# Patient Record
Sex: Male | Born: 1935 | Race: Black or African American | Hispanic: No | Smoking: Never smoker
Health system: Southern US, Community
[De-identification: ages and names within clinical notes are randomized; demographics above are authoritative.]

## PROBLEM LIST (undated history)

## (undated) DIAGNOSIS — E119 Type 2 diabetes mellitus without complications: Secondary | ICD-10-CM

## (undated) DIAGNOSIS — U071 COVID-19: Secondary | ICD-10-CM

## (undated) DIAGNOSIS — I10 Essential (primary) hypertension: Secondary | ICD-10-CM

## (undated) DIAGNOSIS — I509 Heart failure, unspecified: Secondary | ICD-10-CM

## (undated) HISTORY — DX: Type 2 diabetes mellitus without complications: E11.9

## (undated) HISTORY — PX: STOMACH SURGERY: SHX791

## (undated) HISTORY — PX: HIP SURGERY: SHX245

## (undated) HISTORY — DX: Essential (primary) hypertension: I10

---

## 2003-06-26 ENCOUNTER — Other Ambulatory Visit: Payer: Self-pay

## 2006-12-21 ENCOUNTER — Emergency Department: Payer: Self-pay | Admitting: Emergency Medicine

## 2007-12-02 ENCOUNTER — Inpatient Hospital Stay: Payer: Self-pay | Admitting: General Surgery

## 2007-12-02 ENCOUNTER — Other Ambulatory Visit: Payer: Self-pay

## 2010-12-10 ENCOUNTER — Emergency Department: Payer: Self-pay | Admitting: Unknown Physician Specialty

## 2012-03-28 ENCOUNTER — Inpatient Hospital Stay: Payer: Self-pay | Admitting: Internal Medicine

## 2012-03-28 LAB — URINALYSIS, COMPLETE
Ketone: NEGATIVE
Nitrite: POSITIVE
Protein: NEGATIVE
Specific Gravity: 1.009 (ref 1.003–1.030)
Squamous Epithelial: NONE SEEN
WBC UR: 797 /HPF (ref 0–5)

## 2012-03-28 LAB — CBC
MCH: 30.4 pg (ref 26.0–34.0)
MCHC: 34.3 g/dL (ref 32.0–36.0)
MCV: 89 fL (ref 80–100)
Platelet: 188 10*3/uL (ref 150–440)
RDW: 14.3 % (ref 11.5–14.5)

## 2012-03-28 LAB — COMPREHENSIVE METABOLIC PANEL
BUN: 42 mg/dL — ABNORMAL HIGH (ref 7–18)
Bilirubin,Total: 0.7 mg/dL (ref 0.2–1.0)
Calcium, Total: 9.4 mg/dL (ref 8.5–10.1)
Chloride: 100 mmol/L (ref 98–107)
Creatinine: 1.76 mg/dL — ABNORMAL HIGH (ref 0.60–1.30)
EGFR (African American): 37 — ABNORMAL LOW
EGFR (Non-African Amer.): 32 — ABNORMAL LOW
Glucose: 263 mg/dL — ABNORMAL HIGH (ref 65–99)
Osmolality: 290 (ref 275–301)
Potassium: 5.3 mmol/L — ABNORMAL HIGH (ref 3.5–5.1)
SGOT(AST): 23 U/L (ref 15–37)
SGPT (ALT): 22 U/L (ref 12–78)
Sodium: 135 mmol/L — ABNORMAL LOW (ref 136–145)

## 2012-03-28 LAB — CK TOTAL AND CKMB (NOT AT ARMC)
CK, Total: 700 U/L — ABNORMAL HIGH (ref 35–232)
CK-MB: 4.3 ng/mL — ABNORMAL HIGH (ref 0.5–3.6)

## 2012-03-29 LAB — BASIC METABOLIC PANEL
Anion Gap: 7 (ref 7–16)
BUN: 37 mg/dL — ABNORMAL HIGH (ref 7–18)
Chloride: 106 mmol/L (ref 98–107)
Creatinine: 1.31 mg/dL — ABNORMAL HIGH (ref 0.60–1.30)
EGFR (African American): 53 — ABNORMAL LOW
EGFR (Non-African Amer.): 46 — ABNORMAL LOW
Glucose: 145 mg/dL — ABNORMAL HIGH (ref 65–99)
Osmolality: 285 (ref 275–301)

## 2012-03-29 LAB — CBC WITH DIFFERENTIAL/PLATELET
Basophil %: 0.3 %
Eosinophil %: 0.4 %
HCT: 34 % — ABNORMAL LOW (ref 40.0–52.0)
HGB: 11.2 g/dL — ABNORMAL LOW (ref 13.0–18.0)
Lymphocyte #: 0.9 10*3/uL — ABNORMAL LOW (ref 1.0–3.6)
Lymphocyte %: 12 %
MCV: 89 fL (ref 80–100)
Monocyte #: 0.8 x10 3/mm (ref 0.2–1.0)
Monocyte %: 10.9 %
RDW: 14.2 % (ref 11.5–14.5)
WBC: 7.2 10*3/uL (ref 3.8–10.6)

## 2012-03-29 LAB — CK: CK, Total: 859 U/L — ABNORMAL HIGH (ref 35–232)

## 2012-03-30 LAB — BASIC METABOLIC PANEL
BUN: 24 mg/dL — ABNORMAL HIGH (ref 7–18)
Chloride: 102 mmol/L (ref 98–107)
Creatinine: 0.97 mg/dL (ref 0.60–1.30)
EGFR (African American): >60
EGFR (Non-African Amer.): >60
Glucose: 230 mg/dL — ABNORMAL HIGH (ref 65–99)
Osmolality: 281 (ref 275–301)
Potassium: 4.7 mmol/L (ref 3.5–5.1)
Sodium: 135 mmol/L — ABNORMAL LOW (ref 136–145)

## 2012-03-30 LAB — CK: CK, Total: 440 U/L — ABNORMAL HIGH (ref 35–232)

## 2012-03-30 LAB — URINE CULTURE

## 2013-09-27 ENCOUNTER — Emergency Department: Payer: Self-pay | Admitting: Emergency Medicine

## 2013-09-27 LAB — CBC
HCT: 35.1 % — AB (ref 40.0–52.0)
HGB: 11.6 g/dL — ABNORMAL LOW (ref 13.0–18.0)
MCH: 29.3 pg (ref 26.0–34.0)
MCHC: 32.9 g/dL (ref 32.0–36.0)
MCV: 89 fL (ref 80–100)
Platelet: 200 10*3/uL (ref 150–440)
RBC: 3.94 10*6/uL — ABNORMAL LOW (ref 4.40–5.90)
RDW: 13.7 % (ref 11.5–14.5)
WBC: 8.7 10*3/uL (ref 3.8–10.6)

## 2013-09-27 LAB — BASIC METABOLIC PANEL
Anion Gap: 7 (ref 7–16)
BUN: 13 mg/dL (ref 7–18)
CO2: 26 mmol/L (ref 21–32)
CREATININE: 1.02 mg/dL (ref 0.60–1.30)
Calcium, Total: 8.9 mg/dL (ref 8.5–10.1)
Chloride: 107 mmol/L (ref 98–107)
EGFR (Non-African Amer.): >60
GLUCOSE: 132 mg/dL — AB (ref 65–99)
Osmolality: 281 (ref 275–301)
Potassium: 4.6 mmol/L (ref 3.5–5.1)
SODIUM: 140 mmol/L (ref 136–145)

## 2013-09-27 LAB — PRO B NATRIURETIC PEPTIDE: B-TYPE NATIURETIC PEPTID: 406 pg/mL (ref 0–450)

## 2013-10-01 ENCOUNTER — Emergency Department: Payer: Self-pay | Admitting: Orthopaedic Surgery

## 2013-10-01 LAB — CBC WITH DIFFERENTIAL/PLATELET
BASOS ABS: 0 10*3/uL (ref 0.0–0.1)
Basophil %: 0.4 %
Eosinophil #: 0.1 10*3/uL (ref 0.0–0.7)
Eosinophil %: 0.9 %
HCT: 38.8 % — AB (ref 40.0–52.0)
HGB: 12.8 g/dL — ABNORMAL LOW (ref 13.0–18.0)
LYMPHS ABS: 0.9 10*3/uL — AB (ref 1.0–3.6)
LYMPHS PCT: 11.6 %
MCH: 29.8 pg (ref 26.0–34.0)
MCHC: 33 g/dL (ref 32.0–36.0)
MCV: 90 fL (ref 80–100)
MONO ABS: 0.7 x10 3/mm (ref 0.2–1.0)
Monocyte %: 8.6 %
NEUTROS PCT: 78.5 %
Neutrophil #: 6.4 10*3/uL (ref 1.4–6.5)
Platelet: 239 10*3/uL (ref 150–440)
RBC: 4.3 10*6/uL — ABNORMAL LOW (ref 4.40–5.90)
RDW: 13.8 % (ref 11.5–14.5)
WBC: 8.2 10*3/uL (ref 3.8–10.6)

## 2013-10-01 LAB — BASIC METABOLIC PANEL
Anion Gap: 7 (ref 7–16)
BUN: 17 mg/dL (ref 7–18)
CHLORIDE: 105 mmol/L (ref 98–107)
CREATININE: 1.36 mg/dL — AB (ref 0.60–1.30)
Calcium, Total: 8.9 mg/dL (ref 8.5–10.1)
Co2: 27 mmol/L (ref 21–32)
EGFR (African American): 50 — ABNORMAL LOW
GFR CALC NON AF AMER: 43 — AB
Glucose: 199 mg/dL — ABNORMAL HIGH (ref 65–99)
OSMOLALITY: 285 (ref 275–301)
Potassium: 4.6 mmol/L (ref 3.5–5.1)
Sodium: 139 mmol/L (ref 136–145)

## 2013-10-01 LAB — TROPONIN I: Troponin-I: <0.02 ng/mL

## 2013-10-12 ENCOUNTER — Other Ambulatory Visit: Payer: Self-pay | Admitting: *Deleted

## 2013-10-12 ENCOUNTER — Encounter: Payer: Self-pay | Admitting: Podiatrist

## 2013-10-12 ENCOUNTER — Ambulatory Visit (INDEPENDENT_AMBULATORY_CARE_PROVIDER_SITE_OTHER): Payer: Medicare PPO | Admitting: Podiatrist

## 2013-10-12 VITALS — BP 136/74 | HR 94 | Resp 18 | Ht 67.0 in | Wt 150.0 lb

## 2013-10-12 DIAGNOSIS — B351 Tinea unguium: Secondary | ICD-10-CM

## 2013-10-12 DIAGNOSIS — M79609 Pain in unspecified limb: Secondary | ICD-10-CM

## 2013-10-12 NOTE — Patient Instructions (Signed)
Diabetes and Foot Care Diabetes may cause you to have problems because of poor blood supply (circulation) to your feet and legs. This may cause the skin on your feet to become thinner, break easier, and heal more slowly. Your skin may become dry, and the skin may peel and crack. You may also have nerve damage in your legs and feet causing decreased feeling in them. You may not notice minor injuries to your feet that could lead to infections or more serious problems. Taking care of your feet is one of the most important things you can do for yourself.  HOME CARE INSTRUCTIONS  Wear shoes at all times, even in the house. Do not go barefoot. Bare feet are easily injured.  Check your feet daily for blisters, cuts, and redness. If you cannot see the bottom of your feet, use a mirror or ask someone for help.  Wash your feet with warm water (do not use hot water) and mild soap. Then pat your feet and the areas between your toes until they are completely dry. Do not soak your feet as this can dry your skin.  Apply a moisturizing lotion or petroleum jelly (that does not contain alcohol and is unscented) to the skin on your feet and to dry, brittle toenails. Do not apply lotion between your toes.  Trim your toenails straight across. Do not dig under them or around the cuticle. File the edges of your nails with an emery board or nail file.  Do not cut corns or calluses or try to remove them with medicine.  Wear clean socks or stockings every day. Make sure they are not too tight. Do not wear knee-high stockings since they may decrease blood flow to your legs.  Wear shoes that fit properly and have enough cushioning. To break in new shoes, wear them for just a few hours a day. This prevents you from injuring your feet. Always look in your shoes before you put them on to be sure there are no objects inside.  Do not cross your legs. This may decrease the blood flow to your feet.  If you find a minor scrape,  cut, or break in the skin on your feet, keep it and the skin around it clean and dry. These areas may be cleansed with mild soap and water. Do not cleanse the area with peroxide, alcohol, or iodine.  When you remove an adhesive bandage, be sure not to damage the skin around it.  If you have a wound, look at it several times a day to make sure it is healing.  Do not use heating pads or hot water bottles. They may burn your skin. If you have lost feeling in your feet or legs, you may not know it is happening until it is too late.  Make sure your health care provider performs a complete foot exam at least annually or more often if you have foot problems. Report any cuts, sores, or bruises to your health care provider immediately. SEEK MEDICAL CARE IF:   You have an injury that is not healing.  You have cuts or breaks in the skin.  You have an ingrown nail.  You notice redness on your legs or feet.  You feel burning or tingling in your legs or feet.  You have pain or cramps in your legs and feet.  Your legs or feet are numb.  Your feet always feel cold. SEEK IMMEDIATE MEDICAL CARE IF:   There is increasing redness,   swelling, or pain in or around a wound.  There is a red line that goes up your leg.  Pus is coming from a wound.  You develop a fever or as directed by your health care provider.  You notice a bad smell coming from an ulcer or wound. Document Released: 05/22/2000 Document Revised: 01/25/2013 Document Reviewed: 11/01/2012 [employer] Patient Information 2014 [employer].  

## 2013-10-12 NOTE — Progress Notes (Signed)
   Subjective:    Patient ID: Frank Delgado, male    DOB: 01/20/1914, 80 y.o.   MRN: [id number]  HPI Comments: Painful thick toenails . Diabetic 1 year      Review of Systems  HENT: Positive for hearing loss.   Cardiovascular: Positive for leg swelling.  Endocrine: Positive for cold intolerance.       Diabetes Increased urination  Musculoskeletal:       Joint pain   Neurological: Positive for weakness.  Hematological: Bruises/bleeds easily.  Psychiatric/Behavioral: Positive for confusion.       Objective:   Physical Exam Vascular status reduced with decreased pedal pulses at 1/4 dp and pt left and non palpable pulses right.  Swelling is present on the right lower extremity. Patient's son relates he has had cellulitis from elevated salt intake and swelling in the recent past.   Neurological exam decreased via SWMF 5.07 at 3/5 sites bilateral. Light touch is decreased and vibratory sensation is absent bilateral Musculoskeletal exam is acceptable with dorsiflexion, plantar flexion, inversion and eversion ok. Dermatological exam.  Swelling is present right greater than left.  No interdigital maceration present.  All nails are elongated and thick with the right great toenail being the most severe.       Assessment & Plan:  Symptomatic mycotic toenails bilateral.  Plan: debridement carried out without complication.  Recommended routine foot checks and gave information on diabetes and foot health.

## 2013-10-30 ENCOUNTER — Emergency Department: Payer: Self-pay | Admitting: Emergency Medicine

## 2013-10-30 LAB — BASIC METABOLIC PANEL
Anion Gap: 4 — ABNORMAL LOW (ref 7–16)
BUN: 29 mg/dL — AB (ref 7–18)
CHLORIDE: 103 mmol/L (ref 98–107)
Calcium, Total: 10 mg/dL (ref 8.5–10.1)
Co2: 27 mmol/L (ref 21–32)
Creatinine: 1.27 mg/dL (ref 0.60–1.30)
EGFR (African American): 55 — ABNORMAL LOW
EGFR (Non-African Amer.): 47 — ABNORMAL LOW
GLUCOSE: 171 mg/dL — AB (ref 65–99)
Osmolality: 278 (ref 275–301)
Potassium: 4.1 mmol/L (ref 3.5–5.1)
Sodium: 134 mmol/L — ABNORMAL LOW (ref 136–145)

## 2013-10-30 LAB — CBC
HCT: 35.4 % — AB (ref 40.0–52.0)
HGB: 11.9 g/dL — ABNORMAL LOW (ref 13.0–18.0)
MCH: 30.3 pg (ref 26.0–34.0)
MCHC: 33.7 g/dL (ref 32.0–36.0)
MCV: 90 fL (ref 80–100)
Platelet: 217 10*3/uL (ref 150–440)
RBC: 3.94 10*6/uL — ABNORMAL LOW (ref 4.40–5.90)
RDW: 13.5 % (ref 11.5–14.5)
WBC: 7.4 10*3/uL (ref 3.8–10.6)

## 2014-09-25 NOTE — H&P (Signed)
PATIENT NAME:  Frank Delgado, Frank Delgado MR#:  [id number] DATE OF BIRTH:  03/16/1914  DATE OF ADMISSION:  03/28/2012  PRIMARY CARE PHYSICIAN: Dr. Lacie ScottsNiemeyer   CHIEF COMPLAINT: I had a fall.   HISTORY OF PRESENT ILLNESS: This is a 81 year old man who lives alone. He had a fall last night prior to 10 p.m. and was on the floor since 12 noon. No loss of consciousness. He felt his legs gave out on him. He hit the couch, then the floor, and was unable to get up secondary to his knees. Normally walks with a cane. In the ER he was found to have a urinary tract infection, a low-grade temperature, leukocytosis, and to be in acute renal failure with an elevated CPK and hospitalist services were contacted for further evaluation.   PAST MEDICAL HISTORY:  1. Hypertension.  2. Diabetes.  3. Benign prostatic hypertrophy.  4. Hyperlipidemia.  5. History of congestive heart failure.   PAST SURGICAL HISTORY:  1. Bilateral hip replacements x2.  2. Bowel surgery x2.  3. Prostate removal for benign prostatic hypertrophy.   ALLERGIES: No known drug allergies.   MEDICATIONS:  1. Klor-Con 10 mEq daily.  2. Lisinopril 10 mg daily.  3. Lasix 20 mg daily.  4. Glimepiride 2 mg daily.  5. Pravastatin 40 mg at bedtime.  6. Meloxicam 7.5 mg daily.   SOCIAL HISTORY: No smoking. No alcohol. No drug use. Lives alone. Used to work as a Data processing managermaintenance mechanic.   FAMILY HISTORY: Mother died of old age at [id number]. Unknown father family history.  REVIEW OF SYSTEMS: CONSTITUTIONAL: Low-grade fever here in the Emergency Room. No chills or sweats. Chronic right-sided weakness. EYES: He does wear glasses. EARS, NOSE, MOUTH, AND THROAT: Decreased hearing. Positive for runny nose. No sore throat. No difficulty swallowing. CARDIOVASCULAR: No chest pain. No palpitations. RESPIRATORY: Positive for cough. No sputum. No hemoptysis. GASTROINTESTINAL: No nausea. No vomiting. No abdominal pain. No diarrhea. No constipation. No bright red blood per rectum.  No melena. GENITOURINARY: No hematuria. Occasional burning. MUSCULOSKELETAL: No joint pain. INTEGUMENTARY: No rashes or eruptions. NEUROLOGIC: No fainting or blackouts. PSYCHIATRIC: No anxiety or depression. ENDOCRINE: No thyroid problems. HEMATOLOGIC/LYMPHATIC: History of anemia.   PHYSICAL EXAMINATION:   VITAL SIGNS: Temperature 99.7, pulse 90, respirations 22, blood pressure 157/69, pulse oximetry 94% on room air.   GENERAL: No respiratory distress.   EYES: Conjunctivae and lids normal. Pupils equal, round, and reactive to light. Extraocular muscles intact. No nystagmus.   EARS, NOSE, MOUTH, AND THROAT: Tympanic membrane no erythema. Nasal mucosa no erythema. Throat no erythema. No exudate seen. Lips and gums no lesions.   NECK: No JVD. No bruits. No lymphadenopathy. No thyromegaly. No thyroid nodules palpated.   RESPIRATORY: Lungs clear to auscultation. No use of accessory muscles to breathe. No rhonchi, rales, or wheeze heard.   CARDIOVASCULAR: S1, S2 normal. No gallops or rubs. 2/6 systolic ejection murmur. Carotid upstroke 2+ bilaterally. No bruits. Dorsalis pedis pulses 2+ bilaterally. Trace edema of the lower extremity.   ABDOMEN: Soft, nontender. No organomegaly/splenomegaly. Normoactive bowel sounds. No masses felt.   LYMPHATIC: No lymph nodes in the neck.   MUSCULOSKELETAL: No clubbing, edema, or cyanosis.   SKIN: No ulcers seen.   NEUROLOGIC: Cranial nerves II through XII grossly intact. Deep tendon reflexes 2+ bilateral lower extremity. The patient is able to straight leg raise.   PSYCHIATRIC: The patient is oriented to person, place, and time.   LABORATORY AND RADIOLOGICAL DATA: White blood cell count 13.2, hemoglobin and  hematocrit 13.2 and 38.4, platelet count 188, glucose 263, BUN 42, creatinine 1.76, sodium 135, potassium 5.3, chloride 100, CO2 25, calcium 9.4. Liver function tests normal range. GFR 37. CK 700. Urinalysis 3+ leukocyte esterase, 1+ blood, 1+ bacteria.  Troponin negative.   EKG shows normal sinus rhythm, 98 beats per minute. No acute ST-T wave changes.   ASSESSMENT AND PLAN:  1. Acute renal failure. Will give IV fluid hydration. Need to be careful with the patient's history of congestive heart failure. Currently no signs of congestive heart failure. ER physician ordered a fluid bolus. I will continue 1 liter of IV fluids at 50 mL/h. I will hold lisinopril, Lasix, and meloxicam at this point.  2. Systemic inflammatory response syndrome with urinary tract infection, leukocytosis, and low-grade temperature. Will send off a urine culture and give IV Rocephin 1 gram IV q.24 hours.  3. Mild rhabdomyolysis. Will hold pravastatin. Will give IV fluids. Check a CPK in the a.m.  4. Hyperlipidemia. Hold pravastatin at this time.  5. History of congestive heart failure. Currently no signs of congestive heart failure at this time. I will give gentle IV fluids for the patient's acute renal failure. Monitor closely the patient's fluid status.  6. Diabetes. Will continue glimepiride at this point and put on sliding scale.   TIME SPENT ON ADMISSION: 55 minutes.    CODE STATUS: The patient is a FULL CODE.   ____________________________ Herschell Dimes. Renae Gloss, MD rjw:drc D: 03/28/2012 16:36:59 ET T: 03/28/2012 16:59:05 ET JOB#: [id number]  cc: Herschell Dimes. Renae Gloss, MD, <Dictator> Meindert A. Lacie Scotts, MD Salley Scarlet MD ELECTRONICALLY SIGNED 03/30/2012 0:11

## 2014-09-25 NOTE — Discharge Summary (Signed)
PATIENT NAME:  Frank Delgado, Frank Delgado MR#:  [id number] DATE OF BIRTH:  [birth year]  DATE OF ADMISSION:  03/28/2012 DATE OF DISCHARGE:  03/30/2012  DISCHARGE DIAGNOSIS: Fall and weakness due to urinary tract infection.   PRIMARY CARE PHYSICIAN:  Dr. Lacie Scotts.   HISTORY OF PRESENT ILLNESS:  A 81 year old male we lives alone and had a fall the previous night prior to 10:00 p.m. and was on the floor still the next day at noon. He denied any loss of consciousness. He felt his legs gave out on him and he hit the couch, then on the floor and was unable to get up on his own due to his knees. Finally, was brought to the ER and found to have urinary tract infection, low grade temperature, leukocytosis, in acute renal failure, and elevated creatinine phosphokinase level, so was admitted with urinary tract infection and acute renal failure due to it and weakness secondary to that.   HOSPITAL STAY: The patient was started and continued on IV antibiotic and with IV fluid. He had significant improvement in his physical condition and felt much better and stronger the subsequent day. Physical therapy evaluation was done as he has fall and they cleared him for discharge back to home as he was strong enough to carry out all the required activities, so he was finally discharged after confirming the urine culture with oral antibiotic to be finished the course of a total of five days. Discussed with his family and they were present at the bedside and confirmed that he is back to his baseline and he can go home safely now.   OTHER MEDICAL ISSUES HANDLED DURING THIS HOSPITAL STAY:  1. Acute renal failure secondary to urinary tract infection and that improved after IV hydration. 2. Rhabdomyolysis. CK was increased. The patient was receiving IV fluid and it came down. 3. Congestive heart failure. The patient had a history of congestive heart failure, but on presentation, he had no signs of congestive heart failure, so he was maintained  on low volume of IV fluids for his acute renal failure and urinary tract infection and he was never found having any signs and symptoms of heart failure and discharged safely.  4. Diabetes mellitus. Continued on Glimepiride, his home medication and sliding insulin scale.  IMPORTANT LAB RESULTS DURING THE HOSPITAL STAY: Troponin level on admission less than 0.02. WBC 13.2 on admission. Hemoglobin was 13.2 and platelet count was 188 on admission. BUN was 42 and creatinine was 1.76 on admission. Potassium was 5.3 on admission. Total CK was 700. Urinalysis was positive with presence of 797 WBCs, leukocyte esterase 3+ and nitrite positive, bacteria 1+. On urine culture which was collected on the day of admission showed more than 100,000 colony-forming units of Enterobacter heterogeneous and they were sensitive to levofloxacin and ciprofloxacin. Chest x-ray showed mild atelectasis versus infiltrate right lung base and mild cardiomegaly with mild pulmonary vascular prominence. WBCs came down to 7.2 the following day. Hemoglobin also dropped to 11.2, but possibly it was due to correction of his dehydration with IV fluid. His renal function improved with BUN 37 and creatine 1.31 on subsequent day. Potassium level was 5 on the next day and on the third day of the hospital stay his BUN was 24 and creatinine was 0.97 with potassium of 4.7. CK level came down to 440 with IV fluid therapy.   CONDITION ON DISCHARGE: Satisfactory.   CODE STATUS: FULL CODE.   DISCHARGE MEDICATIONS:  1. Aspirin 81 mg once a day.  2. Klor-Con 10 mEq oral tablet extended release once a day. 3. Meloxicam 7.5 mg oral tablet 2 times a day.  4. Glimepiride 2 mg oral tablet once a day.  5. Pravastatin 40 mg oral tablet once a day. 6. Lisinopril 10 mg oral tablet once a day.  7. Furosemide 20 mg tablet once a day.  8. Levaquin 500 mg oral tablet every day for three more days.   REFERRAL: Home health aide advised, physical therapy and nurse  aide as per rehab recommendations.   DIET: Low sodium, regular consistency.   ACTIVITY: As tolerated.   FOLLOWUP: He was advised to follow up within 1 to 2 weeks with his primary care physician, Dr. Evelene CroonMeindert Niemeyer, [facility] at [address], [place], [place].       TOTAL TIME SPENT ON DISCHARGE: 45 minutes.    ____________________________ Hope PigeonVaibhavkumar G. Elisabeth PigeonVachhani, MD vgv:ap D: 03/31/2012 22:00:44 ET T: 04/01/2012 12:04:10 ET JOB#: [id number]  cc: Hope PigeonVaibhavkumar G. Elisabeth PigeonVachhani, MD, <Dictator> Meindert A. Lacie ScottsNiemeyer, MD Altamese DillingVAIBHAVKUMAR Jaden Batchelder MD ELECTRONICALLY SIGNED 04/26/2012 21:53

## 2015-02-10 IMAGING — US US EXTREM LOW VENOUS*R*
1 series · 13 of 24 positions shown · non-contrast
Comparison: None.

CLINICAL DATA: Right leg deep venous thrombosis. Edema of the right
leg.



[Series 1: us extrem low venous*right* · 0.08mm/px · 13 of 33 slices shown]
[im 1/33]
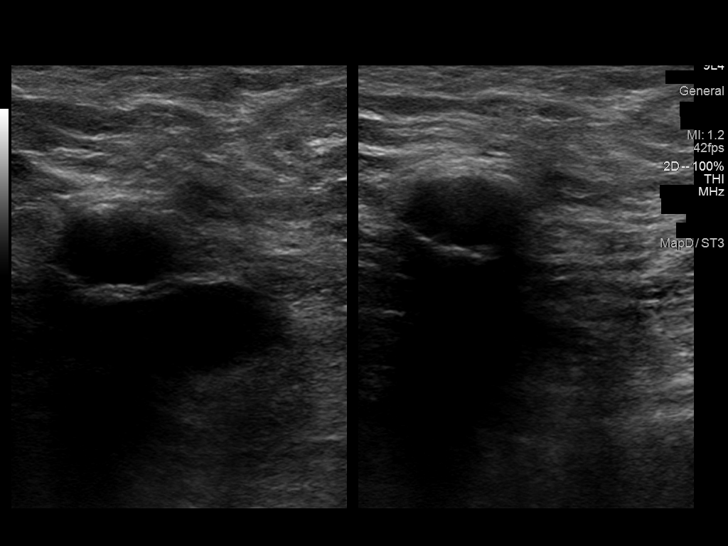
[im 3/33]
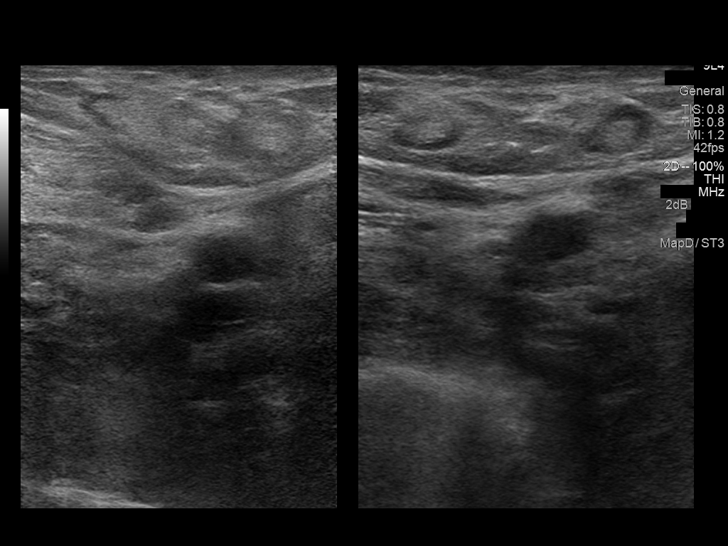
[im 6/33]
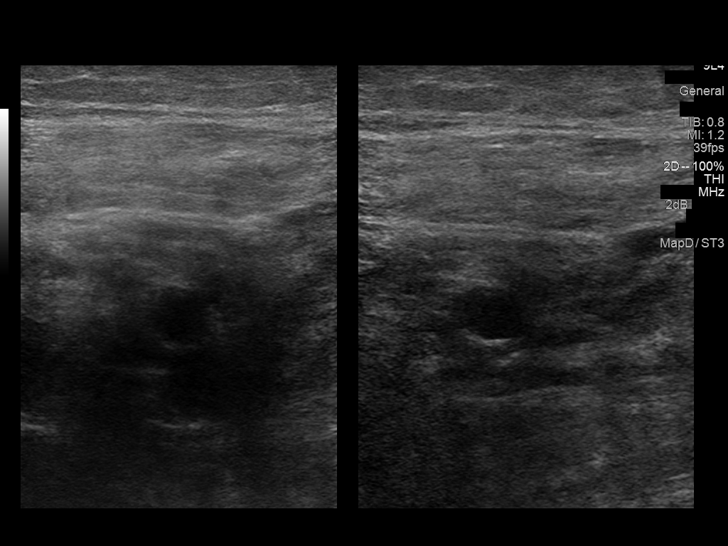
[im 9/33]
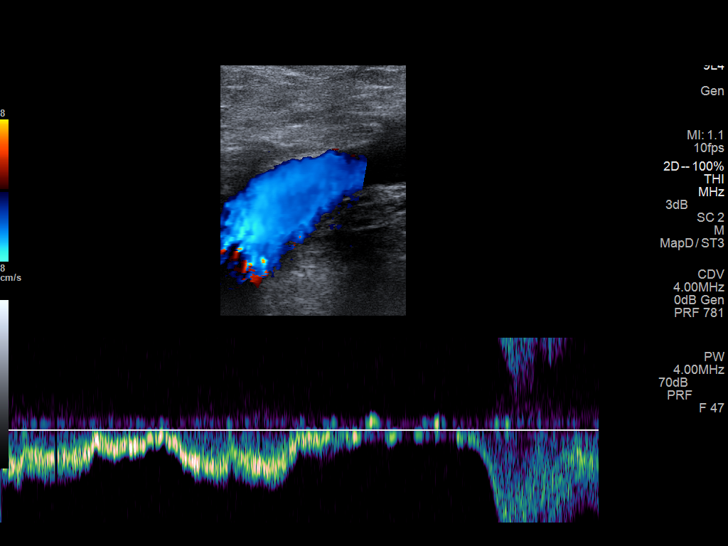
[im 12/33]
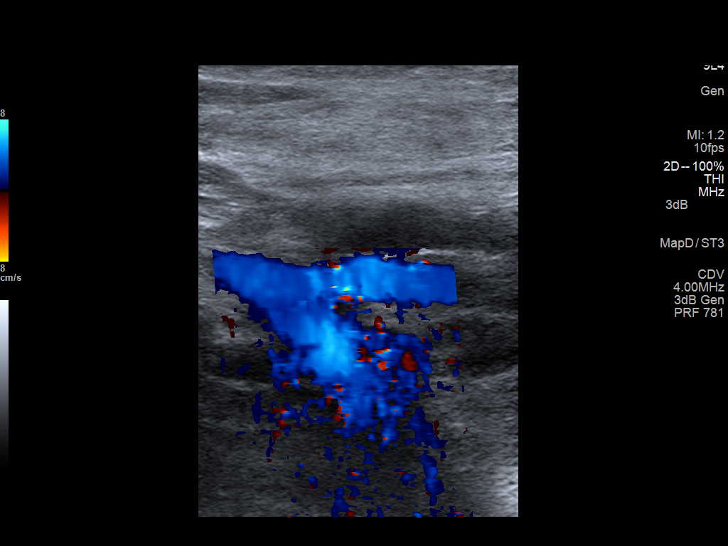
[im 14/33]
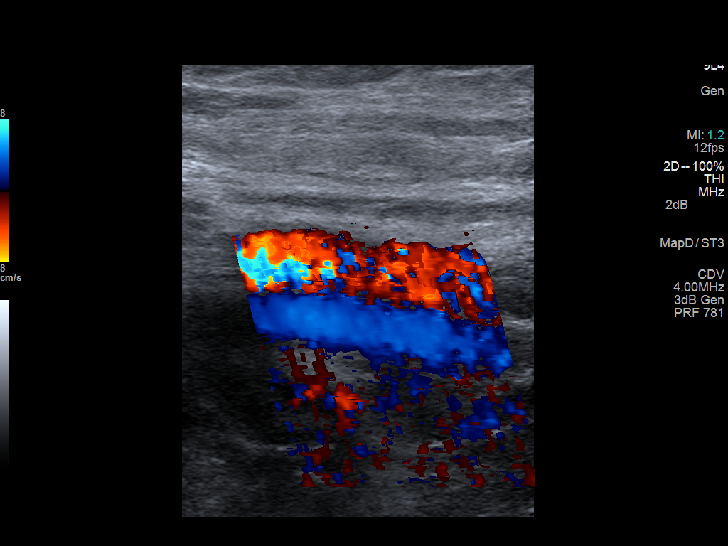
[im 17/33]
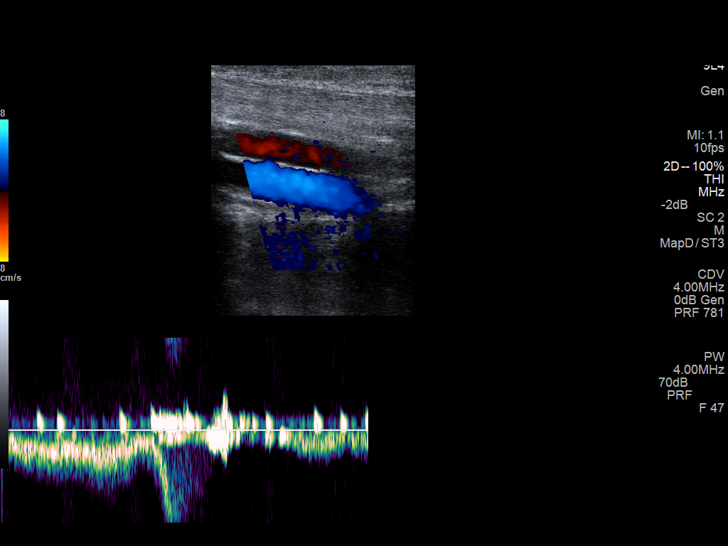
[im 19/33]
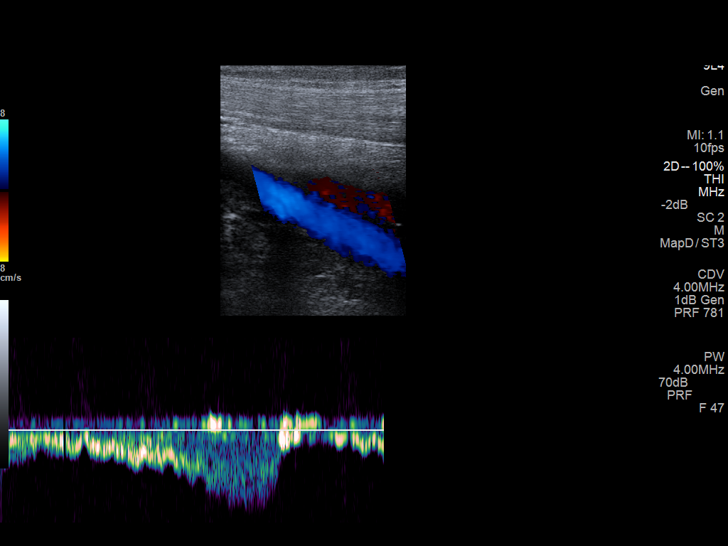
[im 21/33]
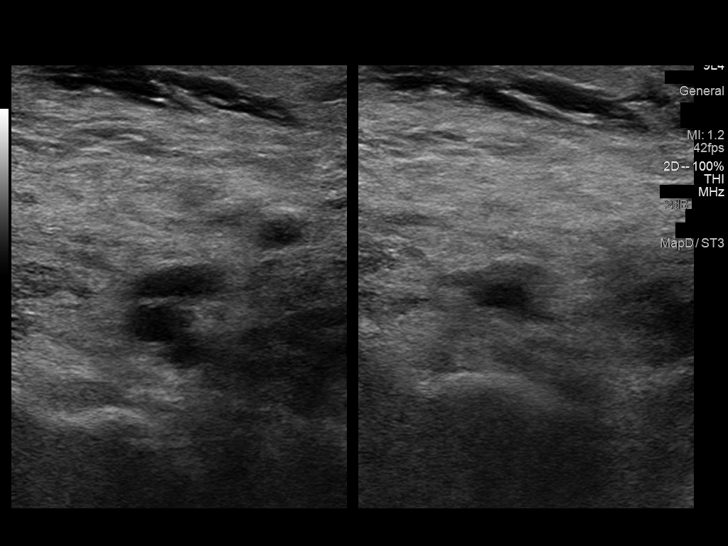
[im 24/33]
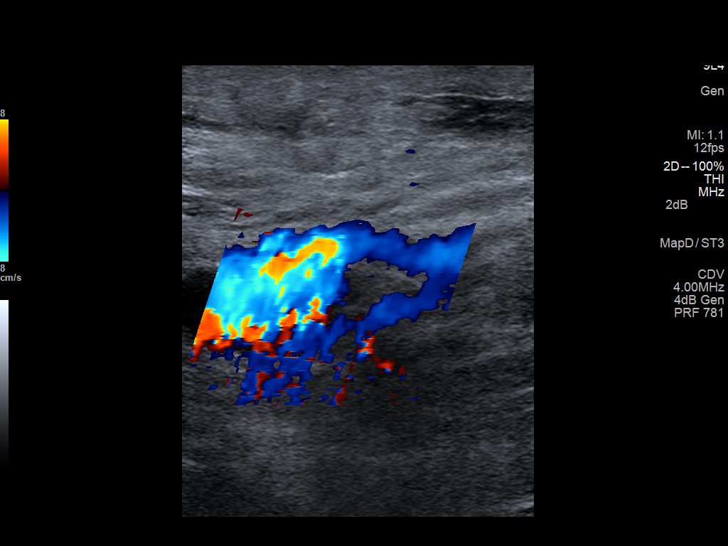
[im 27/33]
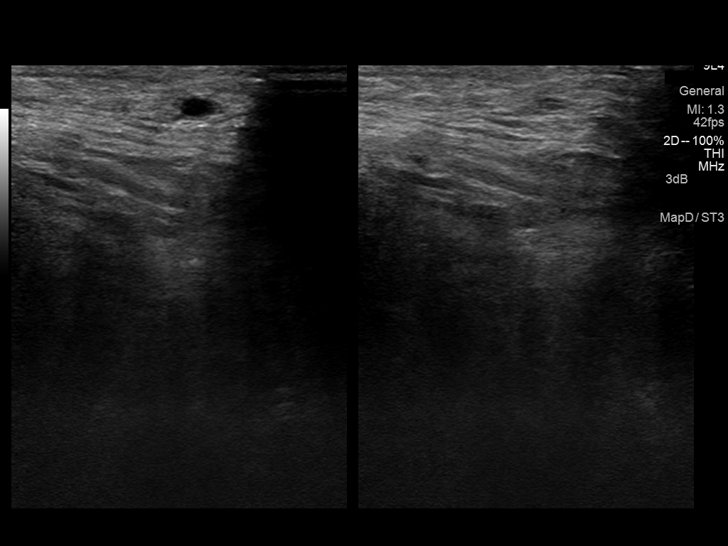
[im 30/33]
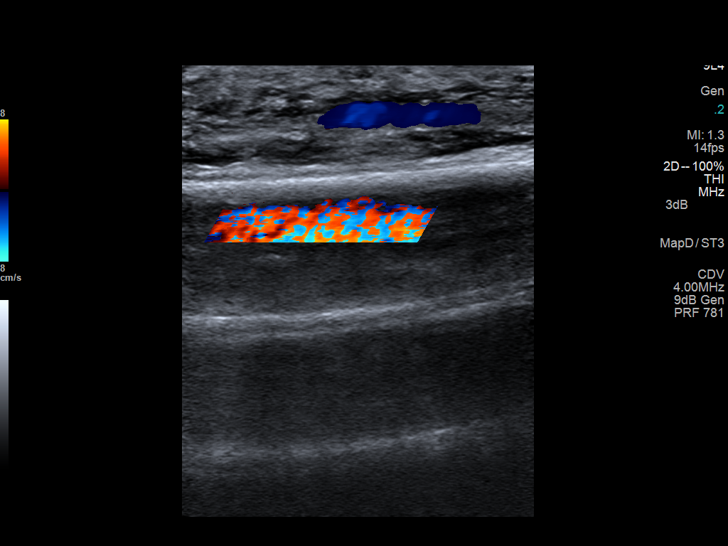
[im 33/33]
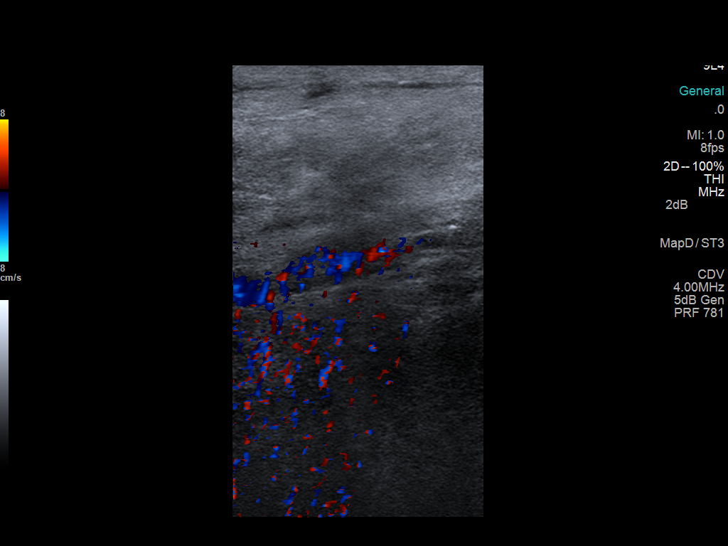

[13 of 24 positions shown; findings below may reference images not displayed]

FINDINGS: Common Femoral Vein: No evidence of thrombus. Normal
compressibility, respiratory phasicity and response to augmentation.

Saphenofemoral Junction: No evidence of thrombus. Normal
compressibility and flow on color Doppler imaging.

Profunda Femoral Vein: No evidence of thrombus. Normal
compressibility and flow on color Doppler imaging.

Femoral Vein: No evidence of thrombus. Normal compressibility,
respiratory phasicity and response to augmentation.

Popliteal Vein: No evidence of thrombus. Normal compressibility,
respiratory phasicity and response to augmentation.

Calf Veins: No evidence of thrombus. Normal compressibility and flow
on color Doppler imaging.

Superficial Great Saphenous Vein: No evidence of thrombus. Normal
compressibility and flow on color Doppler imaging.

Venous Reflux:  None.

Other Findings:  None.
IMPRESSION: No evidence of deep venous thrombosis.

## 2015-06-17 ENCOUNTER — Inpatient Hospital Stay
Admission: EM | Admit: 2015-06-17 | Discharge: 2015-06-20 | DRG: 293 | Disposition: A | Discharge status: Skilled Nursing Facility | Payer: Medicare PPO | Attending: Internal Medicine | Admitting: Internal Medicine

## 2015-06-17 ENCOUNTER — Emergency Department: Payer: Medicare PPO

## 2015-06-17 ENCOUNTER — Inpatient Hospital Stay: Payer: Medicare PPO

## 2015-06-17 ENCOUNTER — Encounter: Payer: Self-pay | Admitting: Emergency Medicine

## 2015-06-17 DIAGNOSIS — Z9889 Other specified postprocedural states: Secondary | ICD-10-CM

## 2015-06-17 DIAGNOSIS — R609 Edema, unspecified: Secondary | ICD-10-CM

## 2015-06-17 DIAGNOSIS — M130 Polyarthritis, unspecified: Secondary | ICD-10-CM | POA: Diagnosis present

## 2015-06-17 DIAGNOSIS — M659 Synovitis and tenosynovitis, unspecified: Secondary | ICD-10-CM | POA: Diagnosis not present

## 2015-06-17 DIAGNOSIS — I110 Hypertensive heart disease with heart failure: Secondary | ICD-10-CM | POA: Diagnosis present

## 2015-06-17 DIAGNOSIS — E119 Type 2 diabetes mellitus without complications: Secondary | ICD-10-CM | POA: Diagnosis present

## 2015-06-17 DIAGNOSIS — M7989 Other specified soft tissue disorders: Secondary | ICD-10-CM

## 2015-06-17 DIAGNOSIS — Z79899 Other long term (current) drug therapy: Secondary | ICD-10-CM | POA: Diagnosis not present

## 2015-06-17 DIAGNOSIS — I5033 Acute on chronic diastolic (congestive) heart failure: Secondary | ICD-10-CM | POA: Diagnosis present

## 2015-06-17 DIAGNOSIS — R601 Generalized edema: Secondary | ICD-10-CM | POA: Diagnosis present

## 2015-06-17 DIAGNOSIS — Z23 Encounter for immunization: Secondary | ICD-10-CM

## 2015-06-17 DIAGNOSIS — M1990 Unspecified osteoarthritis, unspecified site: Secondary | ICD-10-CM | POA: Diagnosis present

## 2015-06-17 DIAGNOSIS — M109 Gout, unspecified: Secondary | ICD-10-CM | POA: Diagnosis not present

## 2015-06-17 DIAGNOSIS — Z8269 Family history of other diseases of the musculoskeletal system and connective tissue: Secondary | ICD-10-CM | POA: Diagnosis not present

## 2015-06-17 HISTORY — DX: Heart failure, unspecified: I50.9

## 2015-06-17 LAB — COMPREHENSIVE METABOLIC PANEL
ALT: 15 U/L — AB (ref 17–63)
AST: 21 U/L (ref 15–41)
Albumin: 2.8 g/dL — ABNORMAL LOW (ref 3.5–5.0)
Alkaline Phosphatase: 62 U/L (ref 38–126)
Anion gap: 9 (ref 5–15)
BUN: 14 mg/dL (ref 6–20)
CALCIUM: 8.6 mg/dL — AB (ref 8.9–10.3)
CO2: 23 mmol/L (ref 22–32)
Chloride: 103 mmol/L (ref 101–111)
Creatinine, Ser: 0.87 mg/dL (ref 0.61–1.24)
Glucose, Bld: 172 mg/dL — ABNORMAL HIGH (ref 65–99)
Potassium: 2.9 mmol/L — CL (ref 3.5–5.1)
Sodium: 135 mmol/L (ref 135–145)
Total Bilirubin: 1.2 mg/dL (ref 0.3–1.2)
Total Protein: 6.9 g/dL (ref 6.5–8.1)

## 2015-06-17 LAB — CBC
HCT: 32.1 % — ABNORMAL LOW (ref 40.0–52.0)
Hemoglobin: 10.7 g/dL — ABNORMAL LOW (ref 13.0–18.0)
MCH: 27.9 pg (ref 26.0–34.0)
MCHC: 33.3 g/dL (ref 32.0–36.0)
MCV: 84 fL (ref 80.0–100.0)
Platelets: 307 10*3/uL (ref 150–440)
RBC: 3.83 MIL/uL — AB (ref 4.40–5.90)
RDW: 15.2 % — ABNORMAL HIGH (ref 11.5–14.5)
WBC: 9.1 10*3/uL (ref 3.8–10.6)

## 2015-06-17 LAB — BRAIN NATRIURETIC PEPTIDE: B NATRIURETIC PEPTIDE 5: 267 pg/mL — AB (ref 0.0–100.0)

## 2015-06-17 LAB — GLUCOSE, CAPILLARY: Glucose-Capillary: 243 mg/dL — ABNORMAL HIGH (ref 65–99)

## 2015-06-17 MED ORDER — POTASSIUM CHLORIDE CRYS ER 20 MEQ PO TBCR
40.0000 meq | EXTENDED_RELEASE_TABLET | Freq: Every day | ORAL | Status: AC
  Administered 2015-06-18 – 2015-06-19 (×2): 40 meq via ORAL
  Filled 2015-06-17 (×2): qty 2

## 2015-06-17 MED ORDER — INSULIN ASPART 100 UNIT/ML ~~LOC~~ SOLN
0.0000 [IU] | Freq: Every day | SUBCUTANEOUS | Status: DC
  Administered 2015-06-17: 2 [IU] via SUBCUTANEOUS
  Administered 2015-06-18: 3 [IU] via SUBCUTANEOUS
  Administered 2015-06-19: 2 [IU] via SUBCUTANEOUS
  Filled 2015-06-17 (×2): qty 2
  Filled 2015-06-17: qty 3

## 2015-06-17 MED ORDER — ASPIRIN EC 81 MG PO TBEC
81.0000 mg | DELAYED_RELEASE_TABLET | Freq: Every day | ORAL | Status: DC
Start: 2015-06-17 — End: 2015-06-20
  Administered 2015-06-18 – 2015-06-20 (×3): 81 mg via ORAL
  Filled 2015-06-17 (×3): qty 1

## 2015-06-17 MED ORDER — INFLUENZA VAC SPLIT QUAD 0.5 ML IM SUSY
0.5000 mL | PREFILLED_SYRINGE | INTRAMUSCULAR | Status: AC
  Administered 2015-06-19: 0.5 mL via INTRAMUSCULAR
  Filled 2015-06-17: qty 0.5

## 2015-06-17 MED ORDER — COLCHICINE 0.6 MG PO TABS
0.6000 mg | ORAL_TABLET | Freq: Every day | ORAL | Status: DC
  Administered 2015-06-18 – 2015-06-20 (×3): 0.6 mg via ORAL
  Filled 2015-06-17 (×4): qty 1

## 2015-06-17 MED ORDER — SODIUM CHLORIDE 0.9 % IV SOLN: 250.0000 mL | INTRAVENOUS | Status: DC | PRN

## 2015-06-17 MED ORDER — FUROSEMIDE 10 MG/ML IJ SOLN
40.0000 mg | Freq: Two times a day (BID) | INTRAMUSCULAR | Status: DC
  Administered 2015-06-18 – 2015-06-19 (×3): 40 mg via INTRAVENOUS
  Filled 2015-06-17 (×3): qty 4

## 2015-06-17 MED ORDER — HYDRALAZINE HCL 20 MG/ML IJ SOLN: 10.0000 mg | Freq: Four times a day (QID) | INTRAMUSCULAR | Status: DC | PRN

## 2015-06-17 MED ORDER — BISACODYL 5 MG PO TBEC: 5.0000 mg | DELAYED_RELEASE_TABLET | Freq: Every day | ORAL | Status: DC | PRN

## 2015-06-17 MED ORDER — SODIUM CHLORIDE 0.9 % IJ SOLN
3.0000 mL | Freq: Two times a day (BID) | INTRAMUSCULAR | Status: DC
  Administered 2015-06-17 – 2015-06-19 (×5): 3 mL via INTRAVENOUS

## 2015-06-17 MED ORDER — GLIMEPIRIDE 2 MG PO TABS
2.0000 mg | ORAL_TABLET | Freq: Every day | ORAL | Status: DC
  Administered 2015-06-18 – 2015-06-19 (×2): 2 mg via ORAL
  Filled 2015-06-17 (×2): qty 1

## 2015-06-17 MED ORDER — POLYETHYLENE GLYCOL 3350 17 G PO PACK: 17.0000 g | PACK | Freq: Every day | ORAL | Status: DC | PRN

## 2015-06-17 MED ORDER — PREDNISONE 50 MG PO TABS: 50.0000 mg | ORAL_TABLET | Freq: Every day | ORAL | Status: AC

## 2015-06-17 MED ORDER — POTASSIUM CHLORIDE CRYS ER 20 MEQ PO TBCR
40.0000 meq | EXTENDED_RELEASE_TABLET | Freq: Once | ORAL | Status: AC
  Administered 2015-06-17: 40 meq via ORAL
  Filled 2015-06-17: qty 2

## 2015-06-17 MED ORDER — ACETAMINOPHEN 650 MG RE SUPP: 650.0000 mg | Freq: Four times a day (QID) | RECTAL | Status: DC | PRN

## 2015-06-17 MED ORDER — SODIUM CHLORIDE 0.9 % IJ SOLN: 3.0000 mL | INTRAMUSCULAR | Status: DC | PRN

## 2015-06-17 MED ORDER — IBUPROFEN 400 MG PO TABS: 400.0000 mg | ORAL_TABLET | Freq: Four times a day (QID) | ORAL | Status: DC | PRN

## 2015-06-17 MED ORDER — PRAVASTATIN SODIUM 20 MG PO TABS
40.0000 mg | ORAL_TABLET | Freq: Every day | ORAL | Status: DC
  Administered 2015-06-17 – 2015-06-19 (×3): 40 mg via ORAL
  Filled 2015-06-17 (×3): qty 2

## 2015-06-17 MED ORDER — DOCUSATE SODIUM 100 MG PO CAPS
100.0000 mg | ORAL_CAPSULE | Freq: Two times a day (BID) | ORAL | Status: DC
  Administered 2015-06-17 – 2015-06-20 (×6): 100 mg via ORAL
  Filled 2015-06-17 (×6): qty 1

## 2015-06-17 MED ORDER — ONDANSETRON HCL 4 MG/2ML IJ SOLN: 4.0000 mg | Freq: Four times a day (QID) | INTRAMUSCULAR | Status: DC | PRN

## 2015-06-17 MED ORDER — LISINOPRIL 10 MG PO TABS
10.0000 mg | ORAL_TABLET | Freq: Every day | ORAL | Status: DC
  Administered 2015-06-18 – 2015-06-20 (×3): 10 mg via ORAL
  Filled 2015-06-17 (×3): qty 1

## 2015-06-17 MED ORDER — POTASSIUM CHLORIDE CRYS ER 20 MEQ PO TBCR
40.0000 meq | EXTENDED_RELEASE_TABLET | ORAL | Status: AC
  Administered 2015-06-17 (×2): 40 meq via ORAL
  Filled 2015-06-17 (×2): qty 2

## 2015-06-17 MED ORDER — ACETAMINOPHEN 325 MG PO TABS: 650.0000 mg | ORAL_TABLET | Freq: Four times a day (QID) | ORAL | Status: DC | PRN

## 2015-06-17 MED ORDER — INSULIN ASPART 100 UNIT/ML ~~LOC~~ SOLN
0.0000 [IU] | Freq: Three times a day (TID) | SUBCUTANEOUS | Status: DC
  Administered 2015-06-18: 5 [IU] via SUBCUTANEOUS
  Administered 2015-06-18: 2 [IU] via SUBCUTANEOUS
  Administered 2015-06-18 – 2015-06-19 (×4): 3 [IU] via SUBCUTANEOUS
  Administered 2015-06-20: 2 [IU] via SUBCUTANEOUS
  Administered 2015-06-20: 5 [IU] via SUBCUTANEOUS
  Filled 2015-06-17 (×2): qty 2
  Filled 2015-06-17 (×2): qty 3
  Filled 2015-06-17 (×2): qty 5

## 2015-06-17 MED ORDER — ONDANSETRON HCL 4 MG PO TABS: 4.0000 mg | ORAL_TABLET | Freq: Four times a day (QID) | ORAL | Status: DC | PRN

## 2015-06-17 MED ORDER — FUROSEMIDE 10 MG/ML IJ SOLN
40.0000 mg | Freq: Once | INTRAMUSCULAR | Status: AC
  Administered 2015-06-17: 40 mg via INTRAVENOUS
  Filled 2015-06-17: qty 4

## 2015-06-17 MED ORDER — ENOXAPARIN SODIUM 40 MG/0.4ML ~~LOC~~ SOLN
40.0000 mg | SUBCUTANEOUS | Status: DC
  Administered 2015-06-17 – 2015-06-19 (×3): 40 mg via SUBCUTANEOUS
  Filled 2015-06-17 (×3): qty 0.4

## 2015-06-17 MED ORDER — SODIUM CHLORIDE 0.9 % IJ SOLN
3.0000 mL | Freq: Two times a day (BID) | INTRAMUSCULAR | Status: DC
  Administered 2015-06-17 – 2015-06-19 (×4): 3 mL via INTRAVENOUS

## 2015-06-17 MED ORDER — ALBUTEROL SULFATE (2.5 MG/3ML) 0.083% IN NEBU: 2.5000 mg | INHALATION_SOLUTION | RESPIRATORY_TRACT | Status: DC | PRN

## 2015-06-17 MED ORDER — FLEET ENEMA 7-19 GM/118ML RE ENEM: 1.0000 | ENEMA | Freq: Once | RECTAL | Status: DC | PRN

## 2015-06-17 NOTE — ED Provider Notes (Signed)
[facility] Emergency Department Provider Note  ____________________________________________  Time seen: On arrival  I have reviewed the triage vital signs and the nursing notes.   HISTORY  Chief Complaint Joint Swelling  Patient is unable to provide significant history of present illness  HPI [name] is a 82 y.o. male who presents via EMS for bilateral hand swelling. He denies injury to the area. No fevers or pain.  Son has arrived and is able to provide more history. Patient has reportedly had swelling to bilateral arms and lower legs intermittently. Son reports that seems significant better now. Reports patient does live with grandson. Has had difficulty ambulating over the last few days because of weakness     Past Medical History  Diagnosis Date  . Diabetes mellitus without complication (HCC)   . Hypertension   . CHF (congestive heart failure) (HCC)     There are no active problems to display for this patient.   Past Surgical History  Procedure Laterality Date  . Hip surgery    . Stomach surgery      Current Outpatient Rx  Name  Route  Sig  Dispense  Refill  . furosemide (LASIX) 40 MG tablet               . glimepiride (AMARYL) 2 MG tablet               . hydrochlorothiazide (HYDRODIURIL) 25 MG tablet   Oral   Take 25 mg by mouth daily.         Marland Kitchen. lisinopril (PRINIVIL,ZESTRIL) 10 MG tablet               . mometasone (ELOCON) 0.1 % ointment               . potassium chloride (K-DUR) 10 MEQ tablet               . pravastatin (PRAVACHOL) 40 MG tablet                 Allergies Review of patient's allergies indicates no known allergies.  History reviewed. No pertinent family history.  Social History Social History  Substance Use Topics  . Smoking status: Never Smoker   . Smokeless tobacco: Never Used  . Alcohol Use: None    Review of Systems  Constitutional: Negative for fever. Eyes:  Negative for visual changes. ENT: Negative for sore throat Cardiovascular: Negative for chest pain. Respiratory: Negative for shortness of breath. Gastrointestinal: Positive abdominal "tightness" Genitourinary: Negative for dysuria. Musculoskeletal: Negative for back pain. Skin: Negative for rash. Neurological: Negative for focal weakness     ____________________________________________   PHYSICAL EXAM:  VITAL SIGNS: ED Triage Vitals  Enc Vitals Group     BP 06/17/15 1209 192/87 mmHg     Pulse Rate 06/17/15 1209 94     Resp 06/17/15 1209 18     Temp 06/17/15 1209 98.1 F (36.7 C)     Temp Source 06/17/15 1209 Oral     SpO2 06/17/15 1209 98 %     Weight 06/17/15 1209 150 lb (68.04 kg)     Height 06/17/15 1209 5\' 10"  (1.778 m)     Head Cir --      Peak Flow --      Pain Score --      Pain Loc --      Pain Edu? --      Excl. in GC? --      Constitutional: Alert and  oriented. Well appearing and in no distress. Eyes: Conjunctivae are normal.  ENT   Head: Normocephalic and atraumatic.   Mouth/Throat: Mucous membranes are moist. Cardiovascular: Normal rate, regular rhythm. Normal and symmetric distal pulses are present in all extremities.  Respiratory: Normal respiratory effort without tachypnea nor retractions. Breath sounds are clear and equal bilaterally. Patient is breathing comfortably without difficulty Gastrointestinal: Abdomen is somewhat firm but nontender, suspect fluid buildup in the abdomen. No distention. There is no CVA tenderness. Genitourinary: deferred Musculoskeletal: Nontender with normal range of motion in all extremities. Patient is right greater than left lower extremity edema. Patient has left greater than right upper extremity edema. Neurologic:  Normal speech and language. No gross focal neurologic deficits are appreciated. Skin:  Skin is warm, dry and intact. No rash noted. Psychiatric: Mood and affect are normal. Patient exhibits  appropriate insight and judgment.  ____________________________________________    LABS (pertinent positives/negatives)  Labs Reviewed  CBC - Abnormal; Notable for the following:    RBC 3.83 (*)    Hemoglobin 10.7 (*)    HCT 32.1 (*)    RDW 15.2 (*)    All other components within normal limits  COMPREHENSIVE METABOLIC PANEL - Abnormal; Notable for the following:    Potassium 2.9 (*)    Glucose, Bld 172 (*)    Calcium 8.6 (*)    Albumin 2.8 (*)    ALT 15 (*)    All other components within normal limits  BRAIN NATRIURETIC PEPTIDE    ____________________________________________   EKG  She is pending  ____________________________________________    RADIOLOGY I have personally reviewed any xrays that were ordered on this patient: Chest x-ray is unremarkable  ____________________________________________   PROCEDURES  Procedure(s) performed: none  Critical Care performed: none  ____________________________________________   INITIAL IMPRESSION / ASSESSMENT AND PLAN / ED COURSE  Pertinent labs & imaging results that were available during my care of the patient were reviewed by me and considered in my medical decision making (see chart for details).  Clinical picture appears most consistent with anasarca, although patient has no difficulty breathing. Labs are significant only for mild hypokalemia which was repleted.  All discussed with the hospitalist for admission given diffuse weakness, anasarca and family unable to care for the patient in this state  ____________________________________________   FINAL CLINICAL IMPRESSION(S) / ED DIAGNOSES  Final diagnoses:  Anasarca     Frank Every, MD 06/17/15 [phone]

## 2015-06-17 NOTE — H&P (Signed)
[facility] - [place] at [facility]   PATIENT NAME: Frank Delgado    MR#:  [id number]  DATE OF BIRTH:  Oct 11, 1913  DATE OF ADMISSION:  06/17/2015  PRIMARY CARE PHYSICIAN: Evelene Croon, MD   REQUESTING/REFERRING PHYSICIAN: Dr. Cyril Loosen  CHIEF COMPLAINT:   Chief Complaint  Patient presents with  . Joint Swelling    hand and lower extremities    HISTORY OF PRESENT ILLNESS:  Frank Delgado  is a 82 y.o. male with a known history of HTN, DM, CHF, Gout here with weakness, pain and swelling all over. His left wrist is bothering the most. He does have LE edema sometimes but has been worsening. No change in meds. At baseline he ambulated with a walker on his own. Lives with grandson but mostly alone as grandson is out of the home for work daily.    PAST MEDICAL HISTORY:   Past Medical History  Diagnosis Date  . Diabetes mellitus without complication (HCC)   . Hypertension   . CHF (congestive heart failure) (HCC)     PAST SURGICAL HISTORY:   Past Surgical History  Procedure Laterality Date  . Hip surgery    . Stomach surgery      SOCIAL HISTORY:   Social History  Substance Use Topics  . Smoking status: Never Smoker   . Smokeless tobacco: Never Used  . Alcohol Use: Not on file    FAMILY HISTORY:  History reviewed. No pertinent family history.  Mother died of old age at 61. No significant problems.  DRUG ALLERGIES:  No Known Allergies  REVIEW OF SYSTEMS:   Review of Systems  Constitutional: Positive for malaise/fatigue. Negative for fever, chills and weight loss.  HENT: Positive for hearing loss. Negative for nosebleeds.   Eyes: Negative for blurred vision, double vision and pain.  Respiratory: Negative for cough, hemoptysis, sputum production, shortness of breath and wheezing.   Cardiovascular: Positive for leg swelling. Negative for chest pain, palpitations and orthopnea.  Gastrointestinal: Negative for nausea, vomiting, abdominal  pain, diarrhea and constipation.  Genitourinary: Negative for dysuria and hematuria.  Musculoskeletal: Positive for myalgias, back pain and joint pain. Negative for falls.  Skin: Negative for rash.  Neurological: Positive for weakness. Negative for dizziness, tremors, sensory change, speech change, focal weakness, seizures and headaches.  Endo/Heme/Allergies: Does not bruise/bleed easily.  Psychiatric/Behavioral: Positive for memory loss. Negative for depression. The patient is not nervous/anxious.     MEDICATIONS AT HOME:   Prior to Admission medications   Medication Sig Start Date End Date Taking? Authorizing Provider  furosemide (LASIX) 40 MG tablet Take 40 mg by mouth daily.     Historical Provider, MD  glimepiride (AMARYL) 2 MG tablet Take 2 mg by mouth daily with breakfast.     Historical Provider, MD  hydrochlorothiazide (HYDRODIURIL) 25 MG tablet Take 25 mg by mouth daily.    Historical Provider, MD  lisinopril (PRINIVIL,ZESTRIL) 10 MG tablet Take 10 mg by mouth daily.     Historical Provider, MD  mometasone (ELOCON) 0.1 % ointment  10/02/13   Historical Provider, MD  potassium chloride (K-DUR) 10 MEQ tablet Take 10 mEq by mouth daily.     Historical Provider, MD  pravastatin (PRAVACHOL) 40 MG tablet Take 40 mg by mouth daily.     Historical Provider, MD      VITAL SIGNS:  Blood pressure 174/76, pulse 86, temperature 98.1 F (36.7 C), temperature source Oral, resp. rate 18, height 5\' 10"  (1.778 m), weight 68.04 kg (  150 lb), SpO2 100 %.  PHYSICAL EXAMINATION:  Physical Exam  GENERAL:  82 y.o.-year-old patient lying in the bed with no acute distress.  EYES: Pupils equal, round, reactive to light and accommodation. No scleral icterus. Extraocular muscles intact.  HEENT: Head atraumatic, normocephalic. Oropharynx and nasopharynx clear. No oropharyngeal erythema, moist oral mucosa . Decreased hearing NECK:  Supple, no jugular venous distention. No thyroid enlargement, no  tenderness.  LUNGS: Normal breath sounds bilaterally, no wheezing, rales, rhonchi. No use of accessory muscles of respiration.  CARDIOVASCULAR: S1, S2 normal. No murmurs, rubs, or gallops.  ABDOMEN: Soft, nontender, nondistended. Bowel sounds present. No organomegaly or mass.  EXTREMITIES: No  cyanosis, or clubbing. + 2 pedal & radial pulses b/l. Bilateral LE edema . LUE edema with redness and warmth left wrist. Tender. NEUROLOGIC: Cranial nerves II through XII are intact. No focal Motor or sensory deficits appreciated b/l PSYCHIATRIC: The patient is alert and awake. Oriented x 3. SKIN: No obvious rash, lesion, or ulcer.   LABORATORY PANEL:   CBC  Recent Labs Lab 06/17/15 1307  WBC 9.1  HGB 10.7*  HCT 32.1*  PLT 307   ------------------------------------------------------------------------------------------------------------------  Chemistries   Recent Labs Lab 06/17/15 1307  NA 135  K 2.9*  CL 103  CO2 23  GLUCOSE 172*  BUN 14  CREATININE 0.87  CALCIUM 8.6*  AST 21  ALT 15*  ALKPHOS 62  BILITOT 1.2   ------------------------------------------------------------------------------------------------------------------  Cardiac Enzymes No results for input(s): TROPONINI in the last 168 hours. ------------------------------------------------------------------------------------------------------------------  RADIOLOGY:  Dg Chest Portable 1 View  06/17/2015  CLINICAL DATA:  Left hand swelling, left lower extremity swelling EXAM: PORTABLE CHEST 1 VIEW COMPARISON:  03/28/2012 FINDINGS: Cardiomediastinal silhouette is stable. Atherosclerotic calcifications of thoracic aorta again noted. Degenerative changes bilateral shoulders. No acute infiltrate or pleural effusion. No pulmonary edema. Mild basilar atelectasis. IMPRESSION: No active disease. Mild basilar atelectasis. Degenerative changes bilateral shoulders. Electronically Signed   By: Natasha MeadLiviu  Pop M.D.   On: 06/17/2015 15:31      IMPRESSION AND PLAN:   * Anasarca Likely from acute on chronic diastolic chf Start IV lasix. Supplement potassium I/Os. Daily weight.  * LUE swelling Possible DVT Check venous US Anti-coagulation if positive  * Left wrist GOUT Start colchicine. 1 dose prednisone.  * HTN Home meds and IV prn meds  * DM SSI  * Weakness Progressive over time and worsened by edema PT consult May need long term care after SNF  * DVT prophylaxis Lovenox   All the records are reviewed and case discussed with ED provider. Management plans discussed with the patient, family and they are in agreement.  CODE STATUS: FULL  TOTAL TIME TAKING CARE OF THIS PATIENT: 45 minutes.    Milagros LollSudini, Jaamal Farooqui R M.D on 06/17/2015 at 4:10 PM  Between 7am to 6pm - Pager - [phone]  After 6pm go to [email or web address] - password [email or web address] [facility]  [facility]  Office  [phone]  CC: Primary care physician; Evelene CroonNIEMEYER, MEINDERT, MD   Note: This dictation was prepared with Dragon dictation along with smaller phrase technology. Any transcriptional errors that result from this process are unintentional.

## 2015-06-17 NOTE — ED Notes (Signed)
Pt presents to ER from home with c/o left hand "swelling" and lower extremity "swelling. Pt is alert and oriented at present.

## 2015-06-17 NOTE — Progress Notes (Signed)
   06/17/15 2057  Clinical Encounter Type  Visited With Patient and family together  Visit Type Initial  Referral From Nurse  Consult/Referral To Chaplain  Chaplain provided patient with education portion of advanced directive. Patient and family will have chaplain paged when forms are completed.   Chaplain Edit Ricciardelli [phone]

## 2015-06-18 ENCOUNTER — Inpatient Hospital Stay: Payer: Medicare PPO

## 2015-06-18 LAB — GLUCOSE, CAPILLARY
GLUCOSE-CAPILLARY: 261 mg/dL — AB (ref 65–99)
Glucose-Capillary: 175 mg/dL — ABNORMAL HIGH (ref 65–99)
Glucose-Capillary: 201 mg/dL — ABNORMAL HIGH (ref 65–99)
Glucose-Capillary: 265 mg/dL — ABNORMAL HIGH (ref 65–99)

## 2015-06-18 LAB — CBC
HEMATOCRIT: 30.6 % — AB (ref 40.0–52.0)
HEMOGLOBIN: 10 g/dL — AB (ref 13.0–18.0)
MCH: 27.6 pg (ref 26.0–34.0)
MCHC: 32.8 g/dL (ref 32.0–36.0)
MCV: 84 fL (ref 80.0–100.0)
Platelets: 322 10*3/uL (ref 150–440)
RBC: 3.64 MIL/uL — ABNORMAL LOW (ref 4.40–5.90)
RDW: 15.6 % — ABNORMAL HIGH (ref 11.5–14.5)
WBC: 10.7 10*3/uL — ABNORMAL HIGH (ref 3.8–10.6)

## 2015-06-18 LAB — SYNOVIAL CELL COUNT + DIFF, W/ CRYSTALS
EOSINOPHILS-SYNOVIAL: 0 %
Lymphocytes-Synovial Fld: 65 %
MONOCYTE-MACROPHAGE-SYNOVIAL FLUID: 9 %
Neutrophil, Synovial: 26 %
OTHER CELLS-SYN: 0
WBC, SYNOVIAL: 93 /mm3 (ref 0–200)

## 2015-06-18 LAB — BASIC METABOLIC PANEL
ANION GAP: 7 (ref 5–15)
BUN: 13 mg/dL (ref 6–20)
CO2: 25 mmol/L (ref 22–32)
Calcium: 8.2 mg/dL — ABNORMAL LOW (ref 8.9–10.3)
Chloride: 101 mmol/L (ref 101–111)
Creatinine, Ser: 0.94 mg/dL (ref 0.61–1.24)
GFR calc Af Amer: >60 mL/min (ref >60)
GLUCOSE: 181 mg/dL — AB (ref 65–99)
POTASSIUM: 3.4 mmol/L — AB (ref 3.5–5.1)
Sodium: 133 mmol/L — ABNORMAL LOW (ref 135–145)

## 2015-06-18 LAB — MAGNESIUM: Magnesium: 1.5 mg/dL — ABNORMAL LOW (ref 1.7–2.4)

## 2015-06-18 MED ORDER — THIAMINE HCL 100 MG/ML IJ SOLN: 100.0000 mg | Freq: Every day | INTRAMUSCULAR | Status: DC

## 2015-06-18 MED ORDER — LORAZEPAM 2 MG/ML IJ SOLN
0.0000 mg | Freq: Two times a day (BID) | INTRAMUSCULAR | Status: DC
Start: 2015-06-20 — End: 2015-06-20

## 2015-06-18 MED ORDER — VITAMIN B-1 100 MG PO TABS
100.0000 mg | ORAL_TABLET | Freq: Every day | ORAL | Status: DC
  Administered 2015-06-19 – 2015-06-20 (×2): 100 mg via ORAL
  Filled 2015-06-18 (×2): qty 1

## 2015-06-18 MED ORDER — LORAZEPAM 1 MG PO TABS: 1.0000 mg | ORAL_TABLET | Freq: Four times a day (QID) | ORAL | Status: DC | PRN

## 2015-06-18 MED ORDER — FOLIC ACID 1 MG PO TABS
1.0000 mg | ORAL_TABLET | Freq: Every day | ORAL | Status: DC
  Administered 2015-06-19 – 2015-06-20 (×2): 1 mg via ORAL
  Filled 2015-06-18 (×2): qty 1

## 2015-06-18 MED ORDER — PREDNISONE 50 MG PO TABS
50.0000 mg | ORAL_TABLET | Freq: Every day | ORAL | Status: DC
  Administered 2015-06-18 – 2015-06-20 (×3): 50 mg via ORAL
  Filled 2015-06-18 (×3): qty 1

## 2015-06-18 MED ORDER — MAGNESIUM SULFATE 2 GM/50ML IV SOLN
2.0000 g | Freq: Once | INTRAVENOUS | Status: AC
  Administered 2015-06-18: 2 g via INTRAVENOUS
  Filled 2015-06-18: qty 50

## 2015-06-18 MED ORDER — LORAZEPAM 2 MG/ML IJ SOLN: 0.0000 mg | Freq: Four times a day (QID) | INTRAMUSCULAR | Status: DC

## 2015-06-18 MED ORDER — LORAZEPAM 2 MG/ML IJ SOLN: 1.0000 mg | Freq: Four times a day (QID) | INTRAMUSCULAR | Status: DC | PRN

## 2015-06-18 MED ORDER — ADULT MULTIVITAMIN W/MINERALS CH
1.0000 | ORAL_TABLET | Freq: Every day | ORAL | Status: DC
  Administered 2015-06-19 – 2015-06-20 (×2): 1 via ORAL
  Filled 2015-06-18 (×2): qty 1

## 2015-06-18 NOTE — Consult Note (Signed)
Reason for Consult: Swollen joints  Referring Physician: Dr. Netta CorriganPatel  Frank Delgado   HPI: History from family. 82 year old Afro-American male. Used to work in [employer]. History of heart failure and diabetes. History of asked arthritis status post hip replacements. 2 years ago had episode of gout. in his wrist and foot. No recurrence. Drinks alcohol daily. Last week he complained of inability to walk because of pain in his legs and knees. Family had to assist him. In the last 2 days he's had decreased ambulation. Swelling in both arms. Worse on the left. Became erythematous. Admitted. Generalized edema. Was on Lasix. No shortness of breath. No pulmonary edema on chest x-ray It was elevated. White count 10,000. Creatinine normal. Plains of pain in both knees to move them. Right wrist and left wrist. Right elbow and left arm Family history of gout. No prior kidney stones. Has received colchicine and prednisone and is better  PMH: Congestive heart failure. Diabetes. Gout.  SURGICAL HISTORY: Bilateral history hip replacements and repairs  Family History: Gout and son and grandson  Social History: Daily alcohol  Allergies: No Known Allergies  Medications:  Scheduled: . aspirin EC  81 mg Oral Daily  . colchicine  0.6 mg Oral Daily  . docusate sodium  100 mg Oral BID  . enoxaparin (LOVENOX) injection  40 mg Subcutaneous Q24H  . furosemide  40 mg Intravenous BID  . glimepiride  2 mg Oral Q breakfast  . Influenza vac split quadrivalent PF  0.5 mL Intramuscular Tomorrow-1000  . insulin aspart  0-5 Units Subcutaneous QHS  . insulin aspart  0-9 Units Subcutaneous TID WC  . lisinopril  10 mg Oral Daily  . potassium chloride  40 mEq Oral Daily  . pravastatin  40 mg Oral QHS  . predniSONE  50 mg Oral Q breakfast  . sodium chloride  3 mL Intravenous Q12H  . sodium chloride  3 mL Intravenous Q12H        ROS: No shortness of breath. No chest pain. Abdomen was  swollen.   PHYSICAL EXAM: Blood pressure 173/77, pulse 84, temperature 101 F (38.3 C), temperature source Axillary, resp. rate 20, height 5\' 10"  (1.778 m), weight 69.954 kg (154 lb 3.5 oz), SpO2 97 %. Seen in the bed. Arousable. Can get up to sitting position with assistance. Skin with diffuse erythema in the left forearm all the way up through the elbow. Has pitting edema in both forearms worse on the left. Pitting edema in the lower extremities as well as presacral area. Sclera clear. Clear chest. Systolic murmur. Cannot feel enlarged liver or spleen. *Skeletal exam: I'll decrease range of motion both shoulders. Can flex both elbows. No olecranon bursal swelling. Small nodule right olecranon bursa left wrist is nearly fused. No definite effusion of the left wrist. Right wrist has some thickening. His both hands. Thickening tenderness across the MCPs bilaterally. His hips with hip roll are not significantly painful. Bilateral knee effusions. Pain with flexion both knees. Mollies range of motion ankles without synovitis.  Assessment: polyarticular synovitis involving both knees, right wrist, possibly elbows Significant erythema and edema left upper arm. Cannot rule out cellulitis versus aggressive tenosynovitis .the left wrist motion today is not particularly tender there is no wrist palpable effusion. Associated with fever Significant edema state. Rule out proteinuria. Rule out low albumin from liver disease Daily alcohol Diabetes Osteoarthritis. Hands knees and hips  Recommendations: Right knee prepped in a sterile manner. Aspirated 20 cc serosanguineous fluid. Sent for microscopy.  Uric acid. Liver functions. Urinalysis to rule out proteinuria Agree with Colcrys and prednisone taper for his joints Left arm elevation. May need anti-edema wraps Consider antibiotic for cellulitis if left arm on improved  Trevor Iha W 06/18/2015, 6:24 PM

## 2015-06-18 NOTE — Progress Notes (Signed)
rn spoke with dr sudini after paging dr patel numerous times. md reports okay to d/c cardiac monitoring. Pt in NSR

## 2015-06-18 NOTE — Evaluation (Signed)
Physical Therapy Evaluation Patient Details Name: Frank Delgado MRN: [id number] DOB: May 19, 1914 Today's Date: 06/18/2015   History of Present Illness  Patient is a 82 y/o male that presents with L UE swelling, DVT ruled out through [place]  Clinical Impression  Patient reports he typically ambulates with a RW, however in this session he demonstrates significant generalized weakness and is unable to come to standing with RW and +2 assistance. He appears to have a contracture (resembling Volkmann's?) on LUE and gout flare up in RUE limiting his ability to use his UEs. Further he demonstrates significant weakness in bilateral LEs and leans posteriorly quite heavily throughout transfer. Patient would benefit from short term rehabilitation after discharge to return to his prior level of function.     Follow Up Recommendations SNF    Equipment Recommendations       Recommendations for Other Services       Precautions / Restrictions Precautions Precautions: Fall Restrictions Weight Bearing Restrictions: No      Mobility  Bed Mobility Overal bed mobility: Needs Assistance Bed Mobility: Supine to Sit;Sit to Supine     Supine to sit: Mod assist Sit to supine: Mod assist   General bed mobility comments: Patient able to complete getting his LEs off the EOB with assistance, however he required significant assistance to bring his torso off the bed.   Transfers Overall transfer level: Needs assistance Equipment used: Rolling walker (2 wheeled) Transfers: Sit to/from Stand Sit to Stand: +2 physical assistance         General transfer comment: Unable to complete sit to stand with +2 assistance.   Ambulation/Gait                Stairs            Wheelchair Mobility    Modified Rankin (Stroke Patients Only)       Balance Overall balance assessment: Needs assistance   Sitting balance-Leahy Scale: Fair Sitting balance - Comments: Able to sit at the edge of the bed for  15 minutes with no loss of balance.      Standing balance-Leahy Scale: Zero                               Pertinent Vitals/Pain Pain Assessment: Faces Faces Pain Scale: Hurts little more Pain Location: R elbow, discussed with RN  "gout"  Pain Descriptors / Indicators: Aching Pain Intervention(s): Limited activity within patient's tolerance;Monitored during session    Home Living Family/patient expects to be discharged to:: Private residence Living Arrangements:  (Grandson works throughout the day) Available Help at Discharge: Available PRN/intermittently;Family Type of Home: House           Additional Comments: Unable to accurately provide home set-up or equipment.     Prior Function Level of Independence: Independent with assistive device(s)         Comments: It appears that patient was ambulating mod independently with RW prior to this admission.      Hand Dominance        Extremity/Trunk Assessment   Upper Extremity Assessment: Generalized weakness           Lower Extremity Assessment: Generalized weakness         Communication   Communication: Expressive difficulties (Speech appears to be somewhat slurred at baseline. )  Cognition Arousal/Alertness: Awake/alert Behavior During Therapy: WFL for tasks assessed/performed Overall Cognitive Status: History of cognitive impairments - at baseline  General Comments      Exercises        Assessment/Plan    PT Assessment Patient needs continued PT services  PT Diagnosis Difficulty walking;Generalized weakness   PT Problem List    PT Treatment Interventions DME instruction;Therapeutic activities;Therapeutic exercise;Gait training;Balance training   PT Goals (Current goals can be found in the Care Plan section) Acute Rehab PT Goals PT Goal Formulation: Patient unable to participate in goal setting Time For Goal Achievement: 07/02/15 Potential to Achieve  Goals: Fair    Frequency Min 2X/week   Barriers to discharge        Co-evaluation               End of Session Equipment Utilized During Treatment: Gait belt Activity Tolerance: Patient tolerated treatment well;Patient limited by fatigue Patient left: in bed;with bed alarm set;with nursing/sitter in room;with call bell/phone within reach Nurse Communication: Mobility status         Time: [id number] PT Time Calculation (min) (ACUTE ONLY): 34 min   Charges:   PT Evaluation $PT Eval Low Complexity: 1 Procedure PT Treatments $Therapeutic Exercise: 8-22 mins   PT G Codes:       Kerin RansomPatrick A McNamara, PT, DPT    06/18/2015, 12:45 PM

## 2015-06-18 NOTE — Progress Notes (Signed)
Spoke to Dr. Allena KatzPatel via phone.  She ordered to give Lasix now, rather than wait later so he wont be up all night voiding.

## 2015-06-18 NOTE — Progress Notes (Signed)
[name] [place] [facility]   PATIENT NAME: Frank SalenCurtis Vandenberghe    MR#:  [id number]  DATE OF BIRTH:  08/11/1913  SUBJECTIVE:   Hard on hearing. C/o UE joint swelling and pain REVIEW OF SYSTEMS:   Review of Systems  Constitutional: Positive for malaise/fatigue. Negative for fever, chills and weight loss.  HENT: Negative for ear discharge, ear pain and nosebleeds.   Eyes: Negative for blurred vision, pain and discharge.  Respiratory: Positive for shortness of breath. Negative for sputum production, wheezing and stridor.   Cardiovascular: Negative for chest pain, palpitations, orthopnea and PND.  Gastrointestinal: Negative for nausea, vomiting, abdominal pain and diarrhea.  Genitourinary: Negative for urgency and frequency.  Musculoskeletal: Positive for joint pain. Negative for back pain.  Neurological: Positive for weakness. Negative for sensory change, speech change and focal weakness.  Psychiatric/Behavioral: Negative for depression and hallucinations. The patient is not nervous/anxious.   All other systems reviewed and are negative.  Tolerating Diet:yes Tolerating PT: SNF  DRUG ALLERGIES:  No Known Allergies  VITALS:  Blood pressure 173/77, pulse 84, temperature 101 F (38.3 C), temperature source Axillary, resp. rate 20, height 5\' 10"  (1.778 m), weight 69.954 kg (154 lb 3.5 oz), SpO2 97 %.  PHYSICAL EXAMINATION:   Physical Exam  GENERAL:  82 y.o.-year-old patient lying in the bed with no acute distress.  EYES: Pupils equal, round, reactive to light and accommodation. No scleral icterus. Extraocular muscles intact.  HEENT: Head atraumatic, normocephalic. Oropharynx and nasopharynx clear.  NECK:  Supple, no jugular venous distention. No thyroid enlargement, no tenderness.  LUNGS: Normal breath sounds bilaterally, no wheezing, rales, rhonchi. No use of accessory muscles of respiration.  CARDIOVASCULAR: S1, S2 normal. No murmurs, rubs, or gallops.   ABDOMEN: Soft, nontender, nondistended. Bowel sounds present. No organomegaly or mass.  EXTREMITIES: No cyanosis, clubbing , ++ edema b/l.   Swelling , redness and decreased  ROM NEUROLOGIC: Cranial nerves II through XII are intact. No focal Motor or sensory deficits b/l.   PSYCHIATRIC: patient is alert and oriented x 3.  SKIN: No obvious rash, lesion, or ulcer.   LABORATORY PANEL:  CBC  Recent Labs Lab 06/18/15 0924  WBC 10.7*  HGB 10.0*  HCT 30.6*  PLT 322    Chemistries   Recent Labs Lab 06/17/15 1307 06/18/15 0924  NA 135 133*  K 2.9* 3.4*  CL 103 101  CO2 23 25  GLUCOSE 172* 181*  BUN 14 13  CREATININE 0.87 0.94  CALCIUM 8.6* 8.2*  MG  --  1.5*  AST 21  --   ALT 15*  --   ALKPHOS 62  --   BILITOT 1.2  --     Cardiac Enzymes No results for input(s): TROPONINI in the last 168 hours. RADIOLOGY:  Dg Elbow Complete Right  06/18/2015  CLINICAL DATA:  Elbow pain EXAM: RIGHT ELBOW - COMPLETE 3+ VIEW COMPARISON:  None. FINDINGS: There is a diffuse soft tissue swelling around the elbow joint. A large joint effusion is present. No fracture identified. There are degenerative changes involving the elbow joint. IMPRESSION: 1. Diffuse soft tissue swelling and large joint effusion. In the acute settings findings may represent an inflammatory or infectious arthropathy. Electronically Signed   By: Signa Kellaylor  Stroud M.D.   On: 06/18/2015 14:22   [place] Venous Img Upper Uni Left  06/17/2015  CLINICAL DATA:  82 year old male with left arm swelling for 5 days. EXAM: LEFT UPPER EXTREMITY VENOUS DOPPLER ULTRASOUND TECHNIQUE: Gray-scale sonography  with graded compression, as well as color Doppler and duplex ultrasound were performed to evaluate the upper extremity deep venous system from the level of the subclavian vein and including the jugular, axillary, basilic, radial, ulnar and upper cephalic vein. Spectral Doppler was utilized to evaluate flow at rest and with distal augmentation maneuvers.  COMPARISON:  None. FINDINGS: Contralateral Subclavian Vein: Respiratory phasicity is normal and symmetric with the symptomatic side. No evidence of thrombus. Normal compressibility. Internal Jugular Vein: No evidence of thrombus. Normal compressibility, respiratory phasicity and response to augmentation. Subclavian Vein: No evidence of thrombus. Normal compressibility, respiratory phasicity and response to augmentation. Axillary Vein: No evidence of thrombus. Normal compressibility, respiratory phasicity and response to augmentation. Cephalic Vein: No evidence of thrombus. Normal compressibility, respiratory phasicity and response to augmentation. Basilic Vein: No evidence of thrombus. Normal compressibility, respiratory phasicity and response to augmentation. Brachial Veins: No evidence of thrombus. Normal compressibility, respiratory phasicity and response to augmentation. Radial Veins: No evidence of thrombus. Normal compressibility, respiratory phasicity and response to augmentation. Ulnar Veins: No evidence of thrombus. Normal compressibility, respiratory phasicity and response to augmentation. Venous Reflux:  None visualized. Other Findings:  None visualized. IMPRESSION: No evidence of left upper extremity deep venous thrombosis. Electronically Signed   By: Rubye Oaks M.D.   On: 06/17/2015 17:59   Dg Chest Portable 1 View  06/17/2015  CLINICAL DATA:  Left hand swelling, left lower extremity swelling EXAM: PORTABLE CHEST 1 VIEW COMPARISON:  03/28/2012 FINDINGS: Cardiomediastinal silhouette is stable. Atherosclerotic calcifications of thoracic aorta again noted. Degenerative changes bilateral shoulders. No acute infiltrate or pleural effusion. No pulmonary edema. Mild basilar atelectasis. IMPRESSION: No active disease. Mild basilar atelectasis. Degenerative changes bilateral shoulders. Electronically Signed   By: Natasha Mead M.D.   On: 06/17/2015 15:31   Dg Hand Complete Left  06/18/2015  CLINICAL  DATA:  Left hand pain redness and hot to touch EXAM: LEFT HAND - COMPLETE 3+ VIEW COMPARISON:  None. FINDINGS: There is severe degenerative change at the radiocarpal joint with severe narrowing sclerosis and osteophyte formation. There is subchondral cystic change in the radius. There is deformity of the scaphoid and lunate which are widely separated with proximal migration of the distal carpal row. There is calcification of the triangular fibrocartilage with subchondral cystic change in the distal ulna. There is moderate to severe arthritic change in the first carpometacarpal joint. Second through fourth digits not well evaluated due to persistent flexion. Mild arthritis of the first interphalangeal joint. Capsular calcification noted of the second and fifth metacarpal phalangeal joints. IMPRESSION: Severe arthritic change. Electronically Signed   By: Esperanza Heir M.D.   On: 06/18/2015 14:21   ASSESSMENT AND PLAN:   * Anasarca Likely from acute on chronic diastolic chf Start IV lasix. Supplement potassium I/Os. Daily weight.  * LUE swelling  venous US negative for DVT  * Left wrist GOUT Start colchicine.  -start po prednisone. -Dr Gavin Potters to see pt  * HTN Home meds and IV prn meds  * DM SSI  * Weakness Progressive over time and worsened by edema PT consult-recommends SNF  * DVT prophylaxis Lovenox  Case discussed with Care Management/Social Worker. Management plans discussed with the patient, family and they are in agreement.   CODE STATUS: full  TOTAL TIME TAKING CARE OF THIS PATIENT: .  >50% time spent on counselling and coordination of care son,dter  POSSIBLE D/C IN 1-2 DAYS, DEPENDING ON CLINICAL CONDITION.  Note: This dictation was prepared with Dragon dictation  along with smaller phrase technology. Any transcriptional errors that result from this process are unintentional.  Kohana Amble M.D on 06/18/2015 at 3:51 PM  Between 7am to 6pm - Pager -  [phone]  After 6pm go to [email or web address] - password [email or web address] [facility]  [place]  [facility]  Office  [phone]  CC: Primary care physician; Evelene Croon, MD

## 2015-06-18 NOTE — Care Management Note (Signed)
Case Management Note  Patient Details  Name: [name] MRN: [id number] Date of Birth: 07/08/1913  Subjective/Objective:                  Met with patient to discuss discharge planning. He states he has a very supportive family that checks on him. He states he "don't walk" but then states he "uses a walker". He states he was going to [facility] but had to change PCP because of his insurance. I spoke with his son Efrain over the phone and Quantel said that a nephew stays with patient in the home but he isn't there around the clock. He states his father is limited with ambulation but uses a walker with wheels and a seat. He states he has changed PCP to Dr. Lavera Guise. He agrees with SNF if needed or home health services.   Action/Plan: PT pending. List of home health agencies left at bedside- [facility] aware. RNCM will continue to follow.   Expected Discharge Date:                  Expected Discharge Plan:     In-House Referral:     Discharge planning Services     Post Acute Care Choice:  Home Health, NA Choice offered to:  Patient, Adult Children Lenise Herald)  DME Arranged:    DME Agency:     HH Arranged:    [place] Agency:     Status of Service:  In process, will continue to follow  Medicare Important Message Given:    Date Medicare IM Given:    Medicare IM give by:    Date Additional Medicare IM Given:    Additional Medicare Important Message give by:     If discussed at [place] of Stay Meetings, dates discussed:    Additional Comments:  Marshell Garfinkel, RN 06/18/2015, 9:45 AM

## 2015-06-19 DIAGNOSIS — I110 Hypertensive heart disease with heart failure: Secondary | ICD-10-CM | POA: Diagnosis not present

## 2015-06-19 LAB — GLUCOSE, CAPILLARY
GLUCOSE-CAPILLARY: 240 mg/dL — AB (ref 65–99)
Glucose-Capillary: 227 mg/dL — ABNORMAL HIGH (ref 65–99)
Glucose-Capillary: 233 mg/dL — ABNORMAL HIGH (ref 65–99)
Glucose-Capillary: 238 mg/dL — ABNORMAL HIGH (ref 65–99)

## 2015-06-19 LAB — HEPATIC FUNCTION PANEL
ALT: 13 U/L — AB (ref 17–63)
AST: 19 U/L (ref 15–41)
Albumin: 2.2 g/dL — ABNORMAL LOW (ref 3.5–5.0)
Alkaline Phosphatase: 54 U/L (ref 38–126)
BILIRUBIN DIRECT: 0.2 mg/dL (ref 0.1–0.5)
BILIRUBIN INDIRECT: 0.7 mg/dL (ref 0.3–0.9)
BILIRUBIN TOTAL: 0.9 mg/dL (ref 0.3–1.2)
Total Protein: 6.4 g/dL — ABNORMAL LOW (ref 6.5–8.1)

## 2015-06-19 LAB — MAGNESIUM: Magnesium: 1.8 mg/dL (ref 1.7–2.4)

## 2015-06-19 LAB — URIC ACID: Uric Acid, Serum: 6.3 mg/dL (ref 4.4–7.6)

## 2015-06-19 MED ORDER — INFLUENZA VAC SPLIT QUAD 0.5 ML IM SUSY
0.5000 mL | PREFILLED_SYRINGE | Freq: Once | INTRAMUSCULAR | Status: AC
  Administered 2015-06-19: 0.5 mL via INTRAMUSCULAR
  Filled 2015-06-19: qty 0.5

## 2015-06-19 MED ORDER — GLIMEPIRIDE 2 MG PO TABS
2.0000 mg | ORAL_TABLET | Freq: Two times a day (BID) | ORAL | Status: DC
  Administered 2015-06-20: 2 mg via ORAL
  Filled 2015-06-19: qty 1

## 2015-06-19 MED ORDER — FUROSEMIDE 40 MG PO TABS
40.0000 mg | ORAL_TABLET | Freq: Every day | ORAL | Status: DC
Start: 2015-06-19 — End: 2015-06-20
  Administered 2015-06-20: 40 mg via ORAL
  Filled 2015-06-19: qty 1

## 2015-06-19 NOTE — Clinical Documentation Improvement (Signed)
Internal Medicine  Abnormal Lab/Test Results:   Component     Latest Ref Rng 06/17/2015 06/18/2015  Potassium     3.5 - 5.1 mmol/L 2.9 (LL) 3.4 (L)    Possible Clinical Conditions associated with below indicators  Hyperkalemia  Other Condition  Cannot Clinically Determine   Supporting Information:  Treatment Provided: Supplement potassium Administration Dose: 40 mEq       Start: 06/18/15 1000 End: 06/19/15 0815 after 2 doses        Please exercise your independent, professional judgment when responding. A specific answer is not anticipated or expected. Please update your documentation within the medical record to reflect your response to this query. Thank you  Thank Barrie DunkerYou,  Regla Fitzgibbon C Abbee [facility]ens Health Information Management [place] [phone]

## 2015-06-19 NOTE — [place] FL2 (Signed)
[place] MEDICAID FL2 LEVEL OF CARE SCREENING TOOL     IDENTIFICATION  Patient Name: Frank Delgado Birthdate: 05/06/1914 Sex: male Admission Date (Current Location): 06/17/2015  [place]   Facility and Address:  [facility], [address], [place], [place] [address]      Provider Number: [id number]  Attending Physician Name and Address:  Enedina FinnerSona Patel, MD  Relative Name and Phone Number:       Current Level of Care: Hospital Recommended Level of Care: Skilled Nursing Facility Prior Approval Number:    Date Approved/Denied:   PASRR Number:  [id number]  Discharge Plan: SNF    Current Diagnoses: Patient Active Problem List   Diagnosis Date Noted  . Anasarca 06/17/2015    Orientation RESPIRATION BLADDER Height & Weight    Self, Time, Situation, Place  Normal Continent 5\' 10"  (177.8 cm) 159 lbs.  BEHAVIORAL SYMPTOMS/MOOD NEUROLOGICAL BOWEL NUTRITION STATUS  Wanderer   Continent Diet (Carb Modified, 2 grams Na)  AMBULATORY STATUS COMMUNICATION OF NEEDS Skin   Limited Assist Verbally Normal                       Personal Care Assistance Level of Assistance  Feeding, Dressing, Bathing Bathing Assistance: Limited assistance Feeding assistance: Limited assistance Dressing Assistance: Limited assistance     Functional Limitations Info  Sight, Hearing, Speech Sight Info: Impaired Hearing Info: Impaired Speech Info: Impaired    SPECIAL CARE FACTORS FREQUENCY  PT (By licensed PT)     PT Frequency: 5              Contractures      Additional Factors Info  Allergies, Code Status Code Status Info: Full Code Allergies Info: No known allergies           Current Medications (06/19/2015):  This is the current hospital active medication list Current Facility-Administered Medications  Medication Dose Route Frequency Provider Last Rate Last Dose  . 0.9 %  sodium chloride infusion  250 mL Intravenous  PRN Milagros LollSrikar Sudini, MD      . acetaminophen (TYLENOL) tablet 650 mg  650 mg Oral Q6H PRN Milagros LollSrikar Sudini, MD       Or  . acetaminophen (TYLENOL) suppository 650 mg  650 mg Rectal Q6H PRN Srikar Sudini, MD      . albuterol (PROVENTIL) (2.5 MG/3ML) 0.083% nebulizer solution 2.5 mg  2.5 mg Nebulization Q2H PRN Srikar Sudini, MD      . aspirin EC tablet 81 mg  81 mg Oral Daily Milagros LollSrikar Sudini, MD   81 mg at 06/19/15 0817  . bisacodyl (DULCOLAX) EC tablet 5 mg  5 mg Oral Daily PRN Srikar Sudini, MD      . colchicine tablet 0.6 mg  0.6 mg Oral Daily Srikar Sudini, MD   0.6 mg at 06/19/15 0816  . docusate sodium (COLACE) capsule 100 mg  100 mg Oral BID Milagros LollSrikar Sudini, MD   100 mg at 06/19/15 0818  . enoxaparin (LOVENOX) injection 40 mg  40 mg Subcutaneous Q24H Milagros LollSrikar Sudini, MD   40 mg at 06/18/15 1937  . folic acid (FOLVITE) tablet 1 mg  1 mg Oral Daily Oralia Manisavid Willis, MD   1 mg at 06/19/15 [id number]  . furosemide (LASIX) tablet 40 mg  40 mg Oral Daily Enedina FinnerSona Patel, MD      . glimepiride (AMARYL) tablet 2 mg  2 mg Oral Q breakfast Milagros LollSrikar Sudini, MD   2 mg at  06/19/15 0817  . hydrALAZINE (APRESOLINE) injection 10 mg  10 mg Intravenous Q6H PRN Srikar Sudini, MD      . ibuprofen (ADVIL,MOTRIN) tablet 400 mg  400 mg Oral Q6H PRN Srikar Sudini, MD      . Influenza vac split quadrivalent PF (FLUARIX) injection 0.5 mL  0.5 mL Intramuscular Once Srikar Sudini, MD      . insulin aspart (novoLOG) injection 0-5 Units  0-5 Units Subcutaneous QHS Milagros Loll, MD   3 Units at 06/18/15 2158  . insulin aspart (novoLOG) injection 0-9 Units  0-9 Units Subcutaneous TID WC Milagros Loll, MD   3 Units at 06/19/15 0818  . lisinopril (PRINIVIL,ZESTRIL) tablet 10 mg  10 mg Oral Daily Milagros Loll, MD   10 mg at 06/19/15 0817  . LORazepam (ATIVAN) injection 0-4 mg  0-4 mg Intravenous Q6H Oralia Manis, MD   0 mg at 06/18/15 2215   Followed by  . [START ON 06/20/2015] LORazepam (ATIVAN) injection 0-4 mg  0-4 mg Intravenous Q12H Oralia Manis, MD      . LORazepam (ATIVAN) tablet 1 mg  1 mg Oral Q6H PRN Oralia Manis, MD       Or  . LORazepam (ATIVAN) injection 1 mg  1 mg Intravenous Q6H PRN Oralia Manis, MD      . multivitamin with minerals tablet 1 tablet  1 tablet Oral Daily Oralia Manis, MD   1 tablet at 06/19/15 0817  . ondansetron (ZOFRAN) tablet 4 mg  4 mg Oral Q6H PRN Milagros Loll, MD       Or  . ondansetron (ZOFRAN) injection 4 mg  4 mg Intravenous Q6H PRN Srikar Sudini, MD      . polyethylene glycol (MIRALAX / GLYCOLAX) packet 17 g  17 g Oral Daily PRN Srikar Sudini, MD      . pravastatin (PRAVACHOL) tablet 40 mg  40 mg Oral QHS Milagros Loll, MD   40 mg at 06/18/15 2158  . predniSONE (DELTASONE) tablet 50 mg  50 mg Oral Q breakfast Enedina Finner, MD   50 mg at 06/19/15 0817  . sodium chloride 0.9 % injection 3 mL  3 mL Intravenous Q12H Milagros Loll, MD   3 mL at 06/19/15 0819  . sodium chloride 0.9 % injection 3 mL  3 mL Intravenous Q12H Milagros Loll, MD   3 mL at 06/19/15 0819  . sodium chloride 0.9 % injection 3 mL  3 mL Intravenous PRN Srikar Sudini, MD      . sodium phosphate (FLEET) 7-19 GM/118ML enema 1 enema  1 enema Rectal Once PRN Srikar Sudini, MD      . thiamine (VITAMIN B-1) tablet 100 mg  100 mg Oral Daily Oralia Manis, MD   100 mg at 06/19/15 1610   Or  . thiamine (B-1) injection 100 mg  100 mg Intravenous Daily Oralia Manis, MD         Discharge Medications: Please see discharge summary for a list of discharge medications.  Relevant Imaging Results:  Relevant Lab Results:   Additional Information SSN:  [id number]  Dede Query, LCSW

## 2015-06-19 NOTE — Progress Notes (Signed)
°   06/19/15 1400  Clinical Encounter Type  Visited With Patient and family together  Visit Type Follow-up;Spiritual support  Referral From Nurse  Consult/Referral To Chaplain  Spiritual Encounters  Spiritual Needs Other (Comment)  Stress Factors  Patient Stress Factors Other (Comment)  Family Stress Factors Loss of control  Advance Directives (For Healthcare)  Does patient have an advance directive? No  Would patient like information on creating an advanced directive? Yes - Transport plannerducational materials given  Chaplain responded to page to unit.  AD previously given and had been filled out but patient was not ready to complete.  Will notify unit chaplain to follow-up tomorrow morning.  Also advised patient's nurse that patient was not ready today but someone can contact the chaplain by 10:00 tomorrow morning. Chaplain [employer] Ext [phone]

## 2015-06-19 NOTE — Consult Note (Signed)
Follow-up Joint pain  No fever. Wrists and knees are better. Less pain and shoulders. Fluid showed positive CPPD xray of wrist showed positive chondrocalcinosis as well as degenerative arthritis Uric acid 6.3  Exam: Mild decrease range of motion both shoulders. Bilateral elbow contractures but no definite effusion or olecranon bursa swelling. Decreased range of motion of both wrists. Left wrist is nearly fused. Aspirated right knee does not have effusion. Left knee with effusion Generalized edema. 2+ presacral edema lower extremity edema as well as pitting edema in the forearms  Impression: Recent pseudogout. Multiple sites. Osteoarthritis. Diffuse edema with anasarca. Low albumin state. nutritional versus liver disease versus proteinuria. Diabetes  Plan: Recommend prednisone taper. May stay on Colcrys for flare joints but then would discontinue. Agree with physical therapy

## 2015-06-19 NOTE — Clinical Social Work Note (Signed)
Clinical Social Work Assessment  Patient Details  Name: Frank Delgado MRN: [id number] Date of Birth: 09/27/1913  Date of referral:  06/19/15               Reason for consult:  Facility Placement                Permission sought to share information with:  Family Supports Permission granted to share information::  Yes, Verbal Permission Granted  Name::     son   Housing/Transportation Living arrangements for the past 2 months:  Single Family Home Source of Information:  Patient Patient Interpreter Needed:  None Criminal Activity/Legal Involvement Pertinent to Current Situation/Hospitalization:  No - Comment as needed Significant Relationships:  Adult Children Lives with:  Adult Children Do you feel safe going back to the place where you live?  Yes Need for family participation in patient care:  Yes (Comment)  Care giving concerns:  Pt would need SNF.    Social Worker assessment / plan:  CSW met with pt to address consult for New SNF. CSW introduced herself and explained role of social work. CSW also explained process of discharging to SNF. Pt is agreeable to discharging to SNF. CSW will follow up with pt's son. CSW intiaited a SNF search and will follow up with bed offers. CSW will continue to follow.   Employment status:  Retired Insurance information:  Managed Medicare PT Recommendations:  Skilled Nursing Facility Information / Referral to community resources:  Skilled Nursing Facility  Patient/Family's Response to care:  Pt was appreciative of CSW support.   Patient/Family's Understanding of and Emotional Response to Diagnosis, Current Treatment, and Prognosis:  Pt understands that he would benefit from STR.   Emotional Assessment Appearance:  Appears stated age Attitude/Demeanor/Rapport:  Other (Pleasant) Affect (typically observed):  Pleasant Orientation:  Oriented to Self, Oriented to Place, Oriented to  Time, Oriented to Situation Alcohol / Substance use:  Never  Used Psych involvement (Current and /or in the community):  No (Comment)  Discharge Needs  Concerns to be addressed:  Adjustment to Illness Readmission within the last 30 days:  No Current discharge risk:  None Barriers to Discharge:  Continued Medical Work up    , LCSW 06/19/2015, 5:15 PM  

## 2015-06-19 NOTE — Clinical Social Work Placement (Signed)
   CLINICAL SOCIAL WORK PLACEMENT  NOTE  Date:  06/19/2015  Patient Details  Name: Frank Delgado MRN: [id number] Date of Birth: 03/30/1914  Clinical Social Work is seeking post-discharge placement for this patient at the Skilled  Nursing Facility level of care (*CSW will initial, date and re-position this form in  chart as items are completed):  Yes   Patient/family provided with [place] Clinical Social Work Department's list of facilities offering this level of care within the geographic area requested by the patient (or if unable, by the patient's family).  Yes   Patient/family informed of their freedom to choose among providers that offer the needed level of care, that participate in Medicare, Medicaid or managed care program needed by the patient, have an available bed and are willing to accept the patient.  Yes   Patient/family informed of [facility]'s ownership interest in [facility] and [facility], as well as of the fact that they are under no obligation to receive care at these facilities.  PASRR submitted to EDS on       PASRR number received on       Existing PASRR number confirmed on 06/19/15     FL2 transmitted to all facilities in geographic area requested by pt/family on 06/19/15     FL2 transmitted to all facilities within larger geographic area on       Patient informed that his/her managed care company has contracts with or will negotiate with certain facilities, including the following:            Patient/family informed of bed offers received.  Patient chooses bed at       Physician recommends and patient chooses bed at      Patient to be transferred to   on  .  Patient to be transferred to facility by       Patient family notified on   of transfer.  Name of family member notified:        PHYSICIAN       Additional Comment:    _______________________________________________ Dede QuerySarah Juaquin [place], LCSW 06/19/2015, 12:22 PM

## 2015-06-19 NOTE — Progress Notes (Signed)
[facility] - [place] at [facility]   PATIENT NAME: Frank Delgado    MR#:  [id number]  DATE OF BIRTH:  1914-03-10  SUBJECTIVE:   Hard on hearing.poor historian. denies any jt pain today. No family in the room REVIEW OF SYSTEMS:   Review of Systems  Constitutional: Negative for fever, chills, weight loss and malaise/fatigue.  HENT: Negative for ear discharge, ear pain and nosebleeds.   Eyes: Negative for blurred vision, pain and discharge.  Respiratory: Negative for sputum production, shortness of breath, wheezing and stridor.   Cardiovascular: Negative for chest pain, palpitations, orthopnea and PND.  Gastrointestinal: Negative for nausea, vomiting, abdominal pain and diarrhea.  Genitourinary: Negative for urgency and frequency.  Musculoskeletal: Positive for joint pain. Negative for back pain.  Neurological: Positive for weakness. Negative for sensory change, speech change and focal weakness.  Psychiatric/Behavioral: Negative for depression and hallucinations. The patient is not nervous/anxious.   All other systems reviewed and are negative.  Tolerating Diet:yes Tolerating PT: SNF  DRUG ALLERGIES:  No Known Allergies  VITALS:  Blood pressure 146/74, pulse 83, temperature 98.9 F (37.2 C), temperature source Oral, resp. rate 18, height 5\' 10"  (1.778 m), weight 72.349 kg (159 lb 8 oz), SpO2 100 %.  PHYSICAL EXAMINATION:   Physical Exam  GENERAL:  82 y.o.-year-old patient lying in the bed with no acute distress.  EYES: Pupils equal, round, reactive to light and accommodation. No scleral icterus. Extraocular muscles intact.  HEENT: Head atraumatic, normocephalic. Oropharynx and nasopharynx clear.  NECK:  Supple, no jugular venous distention. No thyroid enlargement, no tenderness.  LUNGS: Normal breath sounds bilaterally, no wheezing, rales, rhonchi. No use of accessory muscles of respiration.  CARDIOVASCULAR: S1, S2 normal. No murmurs, rubs, or gallops.   ABDOMEN: Soft, nontender, nondistended. Bowel sounds present. No organomegaly or mass.  EXTREMITIES: No cyanosis, clubbing , ++ edema b/l.   Swelling , redness and decreased  ROM NEUROLOGIC: Cranial nerves II through XII are intact. No focal Motor or sensory deficits b/l.   PSYCHIATRIC: patient is alert and oriented x 3.  SKIN: No obvious rash, lesion, or ulcer.   LABORATORY PANEL:  CBC  Recent Labs Lab 06/18/15 0924  WBC 10.7*  HGB 10.0*  HCT 30.6*  PLT 322    Chemistries   Recent Labs Lab 06/18/15 0924 06/19/15 0351  NA 133*  --   K 3.4*  --   CL 101  --   CO2 25  --   GLUCOSE 181*  --   BUN 13  --   CREATININE 0.94  --   CALCIUM 8.2*  --   MG 1.5* 1.8  AST  --  19  ALT  --  13*  ALKPHOS  --  54  BILITOT  --  0.9    Cardiac Enzymes No results for input(s): TROPONINI in the last 168 hours. RADIOLOGY:  Dg Elbow Complete Right  06/18/2015  CLINICAL DATA:  Elbow pain EXAM: RIGHT ELBOW - COMPLETE 3+ VIEW COMPARISON:  None. FINDINGS: There is a diffuse soft tissue swelling around the elbow joint. A large joint effusion is present. No fracture identified. There are degenerative changes involving the elbow joint. IMPRESSION: 1. Diffuse soft tissue swelling and large joint effusion. In the acute settings findings may represent an inflammatory or infectious arthropathy. Electronically Signed   By: Signa Kell M.D.   On: 06/18/2015 14:22   US Venous Img Upper Uni Left  06/17/2015  CLINICAL DATA:  82 year old male with left  arm swelling for 5 days. EXAM: LEFT UPPER EXTREMITY VENOUS DOPPLER ULTRASOUND TECHNIQUE: Gray-scale sonography with graded compression, as well as color Doppler and duplex ultrasound were performed to evaluate the upper extremity deep venous system from the level of the subclavian vein and including the jugular, axillary, basilic, radial, ulnar and upper cephalic vein. Spectral Doppler was utilized to evaluate flow at rest and with distal augmentation  maneuvers. COMPARISON:  None. FINDINGS: Contralateral Subclavian Vein: Respiratory phasicity is normal and symmetric with the symptomatic side. No evidence of thrombus. Normal compressibility. Internal Jugular Vein: No evidence of thrombus. Normal compressibility, respiratory phasicity and response to augmentation. Subclavian Vein: No evidence of thrombus. Normal compressibility, respiratory phasicity and response to augmentation. Axillary Vein: No evidence of thrombus. Normal compressibility, respiratory phasicity and response to augmentation. Cephalic Vein: No evidence of thrombus. Normal compressibility, respiratory phasicity and response to augmentation. Basilic Vein: No evidence of thrombus. Normal compressibility, respiratory phasicity and response to augmentation. Brachial Veins: No evidence of thrombus. Normal compressibility, respiratory phasicity and response to augmentation. Radial Veins: No evidence of thrombus. Normal compressibility, respiratory phasicity and response to augmentation. Ulnar Veins: No evidence of thrombus. Normal compressibility, respiratory phasicity and response to augmentation. Venous Reflux:  None visualized. Other Findings:  None visualized. IMPRESSION: No evidence of left upper extremity deep venous thrombosis. Electronically Signed   By: Rubye OaksMelanie  Ehinger M.D.   On: 06/17/2015 17:59   Dg Hand Complete Left  06/18/2015  CLINICAL DATA:  Left hand pain redness and hot to touch EXAM: LEFT HAND - COMPLETE 3+ VIEW COMPARISON:  None. FINDINGS: There is severe degenerative change at the radiocarpal joint with severe narrowing sclerosis and osteophyte formation. There is subchondral cystic change in the radius. There is deformity of the scaphoid and lunate which are widely separated with proximal migration of the distal carpal row. There is calcification of the triangular fibrocartilage with subchondral cystic change in the distal ulna. There is moderate to severe arthritic change in  the first carpometacarpal joint. Second through fourth digits not well evaluated due to persistent flexion. Mild arthritis of the first interphalangeal joint. Capsular calcification noted of the second and fifth metacarpal phalangeal joints. IMPRESSION: Severe arthritic change. Electronically Signed   By: Esperanza Heiraymond  Rubner M.D.   On: 06/18/2015 14:21   ASSESSMENT AND PLAN:   * Anasarca Likely from acute on chronic diastolic chf Start IV lasix. Supplement potassium I/Os. Daily weight.  * LUE swelling--->redness improved No fever. Will hold off abxs today  venous US negative for DVT  * polyarthritis Start colchicine.  -start po prednisone. -Dr Guillermina Citykernodle's input noted. Fluid appears inflammatory  * HTN Home meds and IV prn meds  * DM SSI  * Weakness Progressive over time and worsened by edema PT consult-recommends SNF  * DVT prophylaxis Lovenox  Case discussed with Care Management/Social Worker. Management plans discussed with the patient, family and they are in agreement.   CODE STATUS: full  TOTAL TIME TAKING CARE OF THIS PATIENT: 30minutes.  >50% time spent on counselling and coordination of care son,dter  POSSIBLE D/C IN 1 DAYS, DEPENDING ON CLINICAL CONDITION.  Note: This dictation was prepared with Dragon dictation along with smaller phrase technology. Any transcriptional errors that result from this process are unintentional.  Cheril Slattery M.D on 06/19/2015 at 4:28 PM  Between 7am to 6pm - Pager - [phone]  After 6pm go to [email or web address] - password [email or web address] [facility]  [place]  Office  [phone]  CC: Primary care physician; Evelene CroonNIEMEYER, MEINDERT, MD

## 2015-06-19 NOTE — Care Management Important Message (Signed)
Important Message  Patient Details  Name: Frank Delgado MRN: [id number] Date of Birth: 03/19/1914   Medicare Important Message Given:  Yes    Olegario MessierKathy A [place] Trawick 06/19/2015, 3:18 PM

## 2015-06-20 LAB — GLUCOSE, CAPILLARY
GLUCOSE-CAPILLARY: 167 mg/dL — AB (ref 65–99)
GLUCOSE-CAPILLARY: 261 mg/dL — AB (ref 65–99)

## 2015-06-20 MED ORDER — FOLIC ACID 1 MG PO TABS: 1.0000 mg | ORAL_TABLET | Freq: Every day | ORAL | Status: DC

## 2015-06-20 MED ORDER — COLCHICINE 0.6 MG PO TABS: 0.6000 mg | ORAL_TABLET | Freq: Every day | ORAL | Status: DC

## 2015-06-20 MED ORDER — DOCUSATE SODIUM 100 MG PO CAPS: 100.0000 mg | ORAL_CAPSULE | Freq: Two times a day (BID) | ORAL | Status: DC

## 2015-06-20 MED ORDER — ADULT MULTIVITAMIN W/MINERALS CH: 1.0000 | ORAL_TABLET | Freq: Every day | ORAL | Status: DC

## 2015-06-20 MED ORDER — ASPIRIN 81 MG PO TBEC: 81.0000 mg | DELAYED_RELEASE_TABLET | Freq: Every day | ORAL | Status: AC

## 2015-06-20 MED ORDER — PREDNISONE 50 MG PO TABS: 50.0000 mg | ORAL_TABLET | Freq: Every day | ORAL | Status: DC

## 2015-06-20 MED ORDER — PREDNISONE 10 MG PO TABS: ORAL_TABLET | ORAL | Status: DC

## 2015-06-20 NOTE — Progress Notes (Signed)
Report called to Frank Delgado, at [facility]

## 2015-06-20 NOTE — Progress Notes (Signed)
   06/20/15 1000  Clinical Encounter Type  Visited With Patient  Visit Type Follow-up  Referral From Family  Consult/Referral To Chaplain  Spiritual Encounters  Spiritual Needs Other (Comment)  Stress Factors  Patient Stress Factors Health changes  Chaplain returned to see if patient wanted to complete AD today. Patient did not want to and I informed him to let us know if he wanted to complete it while hospitalized. Chaplain Lavance Beazer A. Margarita Croke Ext. [phone]

## 2015-06-20 NOTE — Discharge Instructions (Signed)
PT at rehab °

## 2015-06-20 NOTE — Discharge Summary (Signed)
[facility] - [place] at [facility]   PATIENT NAME: Frank Delgado    MR#:  [id number]  DATE OF BIRTH:  1914/03/26  DATE OF ADMISSION:  06/17/2015 ADMITTING PHYSICIAN: Milagros Loll, MD  DATE OF DISCHARGE: 06/20/15  PRIMARY CARE PHYSICIAN: Evelene Croon, MD    ADMISSION DIAGNOSIS:  Anasarca [R60.1] Left arm swelling [M79.89]  DISCHARGE DIAGNOSIS:  Polyarthritis due to Osteoathritis and Pseudogout CPPD/Pseudo gout Acute on chronic diastoilc CHF-mild Leg edema HTn DM-2  SECONDARY DIAGNOSIS:   Past Medical History  Diagnosis Date  . Diabetes mellitus without complication (HCC)   . Hypertension   . CHF (congestive heart failure) [facility])     HOSPITAL COURSE:   * Anasarca Likely from acute on chronic diastolic chf Start IV lasix-->changed to po lasix 40 mg (home dose)  Supplement potassium UOP 2.9liters Daily weight.  * LUE swelling--->redness improved No fever. Will hold off abxs today venous US negative for DVT  * polyarthritis due to Pseudogout. Fluid positive ofr CPPD crystals cont colchicine for about 10 days -start po prednisone taper. Joint swelling much improved -Dr Guillermina City input noted. Fluid appears inflammatory  * HTN Home meds and IV prn meds  * DM SSI  * Weakness Progressive over time and worsened by edema PT consult-recommends SNF  * DVT prophylaxis Lovenox  Overall improved. D/c to rehab today CONSULTS OBTAINED:  Treatment Team:  Kandyce Rud., MD  DRUG ALLERGIES:  No Known Allergies  DISCHARGE MEDICATIONS:   Current Discharge Medication List    START taking these medications   Details  aspirin EC 81 MG EC tablet Take 1 tablet (81 mg total) by mouth daily. Qty: 30 tablet, Refills: 0    colchicine 0.6 MG tablet Take 1 tablet (0.6 mg total) by mouth daily. For 10 days then stop Qty: 10 tablet, Refills: 0    docusate sodium (COLACE) 100 MG capsule Take 1 capsule (100 mg total) by mouth 2  (two) times daily. Qty: 10 capsule, Refills: 0    folic acid (FOLVITE) 1 MG tablet Take 1 tablet (1 mg total) by mouth daily. Qty: 30 tablet, Refills: 0    Multiple Vitamin (MULTIVITAMIN WITH MINERALS) TABS tablet Take 1 tablet by mouth daily. Qty: 30 tablet, Refills: 0    predniSONE (DELTASONE) 10 MG tablet Start 50 mg po daily and then taper by 10 mg po daily then stop Qty: 15 tablet, Refills: 0      CONTINUE these medications which have NOT CHANGED   Details  furosemide (LASIX) 40 MG tablet Take 40 mg by mouth daily.     glimepiride (AMARYL) 2 MG tablet Take 2 mg by mouth daily with breakfast.     lisinopril (PRINIVIL,ZESTRIL) 10 MG tablet Take 10 mg by mouth daily.     potassium chloride (K-DUR) 10 MEQ tablet Take 10 mEq by mouth daily.     pravastatin (PRAVACHOL) 40 MG tablet Take 40 mg by mouth at bedtime.         If you experience worsening of your admission symptoms, develop shortness of breath, life threatening emergency, suicidal or homicidal thoughts you must seek medical attention immediately by calling 911 or calling your MD immediately  if symptoms less severe.  You Must read complete instructions/literature along with all the possible adverse reactions/side effects for all the Medicines you take and that have been prescribed to you. Take any new Medicines after you have completely understood and accept all the possible adverse reactions/side effects.  Please note  You were cared for by a hospitalist during your hospital stay. If you have any questions about your discharge medications or the care you received while you were in the hospital after you are discharged, you can call the unit and asked to speak with the hospitalist on call if the hospitalist that took care of you is not available. Once you are discharged, your primary care physician will handle any further medical issues. Please note that NO REFILLS for any discharge medications will be authorized once you  are discharged, as it is imperative that you return to your primary care physician (or establish a relationship with a primary care physician if you do not have one) for your aftercare needs so that they can reassess your need for medications and monitor your lab values. Today   SUBJECTIVE   Pleasant confusion. Joint swelling and pain better  VITAL SIGNS:  Blood pressure 132/60, pulse 74, temperature 97.5 F (36.4 C), temperature source Oral, resp. rate 18, height 5\' 10"  (1.778 m), weight 65.998 kg (145 lb 8 oz), SpO2 98 %.  I/O:   Intake/Output Summary (Last 24 hours) at 06/20/15 0749 Last data filed at 06/19/15 1903  Gross per 24 hour  Intake    240 ml  Output    500 ml  Net   -260 ml    PHYSICAL EXAMINATION:  GENERAL:  82 y.o.-year-old patient lying in the bed with no acute distress.  EYES: Pupils equal, round, reactive to light and accommodation. No scleral icterus. Extraocular muscles intact.  HEENT: Head atraumatic, normocephalic. Oropharynx and nasopharynx clear.  NECK:  Supple, no jugular venous distention. No thyroid enlargement, no tenderness.  LUNGS: Normal breath sounds bilaterally, no wheezing, rales,rhonchi or crepitation. No use of accessory muscles of respiration.  CARDIOVASCULAR: S1, S2 normal. No murmurs, rubs, or gallops.  ABDOMEN: Soft, non-tender, non-distended. Bowel sounds present. No organomegaly or mass.  EXTREMITIES: No pedal edema, cyanosis, or clubbing.  NEUROLOGIC: Cranial nerves II through XII are intact. Muscle strength 5/5 in all extremities. Sensation intact. Gait not checked.  PSYCHIATRIC:  patient is alert   SKIN: No obvious rash, lesion, or ulcer.   DATA REVIEW:   CBC   Recent Labs Lab 06/18/15 0924  WBC 10.7*  HGB 10.0*  HCT 30.6*  PLT 322    Chemistries   Recent Labs Lab 06/18/15 0924 06/19/15 0351  NA 133*  --   K 3.4*  --   CL 101  --   CO2 25  --   GLUCOSE 181*  --   BUN 13  --   CREATININE 0.94  --   CALCIUM 8.2*   --   MG 1.5* 1.8  AST  --  19  ALT  --  13*  ALKPHOS  --  54  BILITOT  --  0.9    Microbiology Results   No results found for this or any previous visit (from the past 240 hour(s)).  RADIOLOGY:  Dg Elbow Complete Right  06/18/2015  CLINICAL DATA:  Elbow pain EXAM: RIGHT ELBOW - COMPLETE 3+ VIEW COMPARISON:  None. FINDINGS: There is a diffuse soft tissue swelling around the elbow joint. A large joint effusion is present. No fracture identified. There are degenerative changes involving the elbow joint. IMPRESSION: 1. Diffuse soft tissue swelling and large joint effusion. In the acute settings findings may represent an inflammatory or infectious arthropathy. Electronically Signed   By: Signa Kellaylor  Stroud M.D.   On: 06/18/2015 14:22   Dg  Hand Complete Left  06/18/2015  CLINICAL DATA:  Left hand pain redness and hot to touch EXAM: LEFT HAND - COMPLETE 3+ VIEW COMPARISON:  None. FINDINGS: There is severe degenerative change at the radiocarpal joint with severe narrowing sclerosis and osteophyte formation. There is subchondral cystic change in the radius. There is deformity of the scaphoid and lunate which are widely separated with proximal migration of the distal carpal row. There is calcification of the triangular fibrocartilage with subchondral cystic change in the distal ulna. There is moderate to severe arthritic change in the first carpometacarpal joint. Second through fourth digits not well evaluated due to persistent flexion. Mild arthritis of the first interphalangeal joint. Capsular calcification noted of the second and fifth metacarpal phalangeal joints. IMPRESSION: Severe arthritic change. Electronically Signed   By: Esperanza Heir M.D.   On: 06/18/2015 14:21     Management plans discussed with the patient, family and they are in agreement.  CODE STATUS:     Code Status Orders        Start     Ordered   06/17/15 1605  Full code   Continuous     06/17/15 1606    Code Status History     Date Active Date Inactive Code Status Order ID Comments User Context   This patient has a current code status but no historical code status.      TOTAL TIME TAKING CARE OF THIS PATIENT: 40 minutes.    Corry Ihnen M.D on 06/20/2015 at 7:49 AM  Between 7am to 6pm - Pager - [phone] After 6pm go to [email or web address] - password [email or web address] [facility]  [place] [facility]  Office  [phone]  CC: Primary care physician; Evelene Croon, MD

## 2015-06-20 NOTE — Clinical Social Work Note (Signed)
Pt is ready for discharge today to [facility]. [employer] Ber[employer]ley Harveyauth is pending so LOG was granted and accepted. Pt and son were agreeable to and aware of discharge plan. RN called report and EMS provided transportation. CSW is singing off as no further needs identified.   Dede QuerySarah Chanse Kagel, MSW, LCSW Clinical Social Worker [phone]

## 2015-09-27 ENCOUNTER — Emergency Department
Admission: EM | Admit: 2015-09-27 | Discharge: 2015-09-28 | Disposition: A | Discharge status: Home / Self Care | Payer: Medicare PPO | Attending: Emergency Medicine | Admitting: Emergency Medicine

## 2015-09-27 ENCOUNTER — Encounter: Payer: Self-pay | Admitting: Emergency Medicine

## 2015-09-27 DIAGNOSIS — I110 Hypertensive heart disease with heart failure: Secondary | ICD-10-CM | POA: Insufficient documentation

## 2015-09-27 DIAGNOSIS — Z7952 Long term (current) use of systemic steroids: Secondary | ICD-10-CM | POA: Insufficient documentation

## 2015-09-27 DIAGNOSIS — W010XXA Fall on same level from slipping, tripping and stumbling without subsequent striking against object, initial encounter: Secondary | ICD-10-CM | POA: Diagnosis not present

## 2015-09-27 DIAGNOSIS — Y929 Unspecified place or not applicable: Secondary | ICD-10-CM | POA: Diagnosis not present

## 2015-09-27 DIAGNOSIS — Z7984 Long term (current) use of oral hypoglycemic drugs: Secondary | ICD-10-CM | POA: Insufficient documentation

## 2015-09-27 DIAGNOSIS — Y9301 Activity, walking, marching and hiking: Secondary | ICD-10-CM | POA: Diagnosis not present

## 2015-09-27 DIAGNOSIS — Y999 Unspecified external cause status: Secondary | ICD-10-CM | POA: Insufficient documentation

## 2015-09-27 DIAGNOSIS — S299XXA Unspecified injury of thorax, initial encounter: Secondary | ICD-10-CM | POA: Diagnosis present

## 2015-09-27 DIAGNOSIS — S2242XA Multiple fractures of ribs, left side, initial encounter for closed fracture: Secondary | ICD-10-CM | POA: Insufficient documentation

## 2015-09-27 DIAGNOSIS — I509 Heart failure, unspecified: Secondary | ICD-10-CM | POA: Insufficient documentation

## 2015-09-27 DIAGNOSIS — Z7982 Long term (current) use of aspirin: Secondary | ICD-10-CM | POA: Insufficient documentation

## 2015-09-27 DIAGNOSIS — E119 Type 2 diabetes mellitus without complications: Secondary | ICD-10-CM | POA: Insufficient documentation

## 2015-09-27 NOTE — ED Notes (Signed)
Patient states that he was turning around and lost his balance and fell on his walker. Patient with complaint of left rib pain.

## 2015-09-28 ENCOUNTER — Emergency Department: Payer: Medicare PPO

## 2015-09-28 MED ORDER — TRAMADOL HCL 50 MG PO TABS: ORAL_TABLET | ORAL | Status: DC

## 2015-09-28 NOTE — ED Notes (Signed)
Warm blankets provided, family at bedside at this time.

## 2015-09-28 NOTE — ED Provider Notes (Signed)
[facility] Emergency Department Provider Note  ____________________________________________  Time seen: Approximately 1:10 AM  I have reviewed the triage vital signs and the nursing notes.   HISTORY  Chief Complaint Fall    HPI Frank Delgado is a 82 y.o. male who presents for evaluation of left-sided rib pain after having a mechanical fall on top of his walker.  He states that he was walking with the assistance of his walker and was turning but lost his balance and fell directly on top of a walker with the left lateral ribs.  He did not hit his head and he denies headache and neck pain.  He did not lose consciousness.  He had acute onset of moderate pain in the chest wall which is reproducible with palpation.  He does not have any shortness of breath and denies abdominal pain, nausea, vomiting.   Past Medical History  Diagnosis Date  . Diabetes mellitus without complication (HCC)   . Hypertension   . CHF (congestive heart failure) [facility])     Patient Active Problem List   Diagnosis Date Noted  . Anasarca 06/17/2015    Past Surgical History  Procedure Laterality Date  . Hip surgery    . Stomach surgery      Current Outpatient Rx  Name  Route  Sig  Dispense  Refill  . aspirin EC 81 MG EC tablet   Oral   Take 1 tablet (81 mg total) by mouth daily.   30 tablet   0   . colchicine 0.6 MG tablet   Oral   Take 1 tablet (0.6 mg total) by mouth daily. For 10 days then stop   10 tablet   0   . docusate sodium (COLACE) 100 MG capsule   Oral   Take 1 capsule (100 mg total) by mouth 2 (two) times daily.   10 capsule   0   . folic acid (FOLVITE) 1 MG tablet   Oral   Take 1 tablet (1 mg total) by mouth daily.   30 tablet   0   . furosemide (LASIX) 40 MG tablet   Oral   Take 40 mg by mouth daily.          Marland Kitchen glimepiride (AMARYL) 2 MG tablet   Oral   Take 2 mg by mouth daily with breakfast.          . lisinopril (PRINIVIL,ZESTRIL) 10 MG  tablet   Oral   Take 10 mg by mouth daily.          . Multiple Vitamin (MULTIVITAMIN WITH MINERALS) TABS tablet   Oral   Take 1 tablet by mouth daily.   30 tablet   0   . potassium chloride (K-DUR) 10 MEQ tablet   Oral   Take 10 mEq by mouth daily.          . pravastatin (PRAVACHOL) 40 MG tablet   Oral   Take 40 mg by mouth at bedtime.          . predniSONE (DELTASONE) 10 MG tablet      Start 50 mg po daily and then taper by 10 mg po daily then stop   15 tablet   0   . traMADol (ULTRAM) 50 MG tablet      Take 1-2 tablets by mouth every 6 hours as needed for moderate to severe pain   20 tablet   0     Allergies Review of patient's allergies indicates no  known allergies.  No family history on file.  Social History Social History  Substance Use Topics  . Smoking status: Never Smoker   . Smokeless tobacco: Never Used  . Alcohol Use: None    Review of Systems Constitutional: No fever/chills Eyes: No visual changes. ENT: No sore throat. Cardiovascular: Pain in left lateral and posterior ribs Respiratory: Denies shortness of breath. Gastrointestinal: No abdominal pain.  No nausea, no vomiting.  No diarrhea.  No constipation. Genitourinary: Negative for dysuria. Musculoskeletal: Negative for back pain. Skin: Negative for rash. Neurological: Negative for headaches, focal weakness or numbness.  10-point ROS otherwise negative.  ____________________________________________   PHYSICAL EXAM:  VITAL SIGNS: ED Triage Vitals  Enc Vitals Group     BP 09/27/15 2353 162/75 mmHg     Pulse Rate 09/27/15 2353 77     Resp 09/27/15 2353 18     Temp 09/27/15 2353 98.1 F (36.7 C)     Temp Source 09/27/15 2353 Oral     SpO2 09/27/15 2353 98 %     Weight 09/27/15 2353 141 lb (63.957 kg)     Height 09/27/15 2353  (1.778 m)     Head Cir --      Peak Flow --      Pain Score 09/27/15 2354 6     Pain Loc --      Pain Edu? --      Excl. in GC? --      Constitutional: Alert and oriented. Well appearing and in no acute distress. Eyes: Conjunctivae are normal. PERRL. EOMI. Head: Atraumatic. Nose: No congestion/rhinnorhea. Mouth/Throat: Mucous membranes are moist.  Oropharynx non-erythematous. Neck: No stridor.  No meningeal signs.  No cervical spine tenderness to palpation. Cardiovascular: Normal rate, regular rhythm. Good peripheral circulation. Grossly normal heart sounds.   Respiratory: Normal respiratory effort.  No retractions. Lungs CTAB. Gastrointestinal: Soft and nontender. No distention.  Musculoskeletal: No chest wall contusions nor hematomas nor ecchymoses.  Patient has reproducible tenderness to various points primarily on the left lateral/posterior ribs. Neurologic:  Normal speech and language. No gross focal neurologic deficits are appreciated.  Skin:  Skin is warm, dry and intact. No rash noted. Psychiatric: Mood and affect are normal. Speech and behavior are normal.  ____________________________________________   LABS (all labs ordered are listed, but only abnormal results are displayed)  Labs Reviewed - No data to display ____________________________________________  EKG  None ____________________________________________  RADIOLOGY   Dg Ribs Unilateral W/chest Left  09/28/2015  CLINICAL DATA:  82 year old male post fall while using his walker. Left rib pain. EXAM: LEFT RIBS AND CHEST - 3+ VIEW COMPARISON:  Chest radiograph 06/17/2015 FINDINGS: Minimally displaced fracture of anterior lateral left tenth and eleventh rib fracture. Probable nondisplaced anterior lateral left ninth rib fracture. Multiple skin folds project over the left hemithorax without definite pneumothorax. No pulmonary contusion. Mediastinal contours and heart size are normal. Calcification in the left axilla is unchanged. IMPRESSION: Minimally displaced anterior lateral left tenth at eleventh rib fractures, with probable nondisplaced ninth rib  fracture. Multiple skin folds project over the left hemithorax. No definite pneumothorax. Electronically Signed   By: Rubye Oaks M.D.   On: 09/28/2015 00:55    ____________________________________________   PROCEDURES  Procedure(s) performed: None  Critical Care performed: No ____________________________________________   INITIAL IMPRESSION / ASSESSMENT AND PLAN / ED COURSE  Pertinent labs & imaging results that were available during my care of the patient were reviewed by me and considered in my medical decision  making (see chart for details).  The patient has at least 2 and possibly 3 mild rib fractures.  I gave the patient and his family my usual discussion of elderly rib fractures and the risk of pneumonia.  I am providing an incentive spirometer and some pain control although I encouraged them to try over-the-counter Tylenol to reduce the risk of that he has another fall due to medication side effect.  I encouraged very close outpatient follow-up.  The patient is in no distress at this time and is breathing comfortably and easily with no diminished lung sounds.  I gave my usual and customary return precautions.     ____________________________________________  FINAL CLINICAL IMPRESSION(S) / ED DIAGNOSES  Final diagnoses:  Multiple rib fractures, left, closed, initial encounter      NEW MEDICATIONS STARTED DURING THIS VISIT:  New Prescriptions   TRAMADOL (ULTRAM) 50 MG TABLET    Take 1-2 tablets by mouth every 6 hours as needed for moderate to severe pain      Note:  This document was prepared using Dragon voice recognition software and may include unintentional dictation errors.   Loleta Roseory Valaree Fresquez, MD 09/28/15 [phone]

## 2015-09-28 NOTE — Discharge Instructions (Signed)
As we discussed, you have what appears to be multiple small rib fractures as a result of your fall.  These injuries need to heal on their own and we encourage you to take over-the-counter Tylenol according to label instructions for pain control.  Take Tramadol as prescribed for severe pain. Do not drink alcohol, drive or participate in any other potentially dangerous activities while taking this medication as it may make you sleepy. Do not take this medication with any other sedating medications, either prescription or over-the-counter. If you were prescribed Percocet or Vicodin, do not take these with acetaminophen (Tylenol) as it is already contained within these medications.   Use it as little as possible to achieve adequate pain control.  This medication is intended for your use only - do not give any to anyone else and keep it in a secure place where nobody else, especially children, have access to it.  It will also cause or worsen constipation, so you may want to consider taking an over-the-counter stool softener while you are taking this medication.  As we explained, referred fractures like this can cause patients to not want to take deep breaths which can lead to pneumonia.  Please use the incentive spirometer with which you were provided as instructed and follow-up with your regular doctor early next week.  Return to the emergency department with any new or worsening symptoms that concern you or your family.   Rib Fracture A rib fracture is a break or crack in one of the bones of the ribs. The ribs are a group of long, curved bones that wrap around your chest and attach to your spine. They protect your lungs and other organs in the chest cavity. A broken or cracked rib is often painful, but most do not cause other problems. Most rib fractures heal on their own over time. However, rib fractures can be more serious if multiple ribs are broken or if broken ribs move out of place and push against  other structures. CAUSES   A direct blow to the chest. For example, this could happen during contact sports, a car accident, or a fall against a hard object.  Repetitive movements with high force, such as pitching a baseball or having severe coughing spells. SYMPTOMS   Pain when you breathe in or cough.  Pain when someone presses on the injured area. DIAGNOSIS  Your caregiver will perform a physical exam. Various imaging tests may be ordered to confirm the diagnosis and to look for related injuries. These tests may include a chest X-ray, computed tomography (CT), magnetic resonance imaging (MRI), or a bone scan. TREATMENT  Rib fractures usually heal on their own in 1-3 months. The longer healing period is often associated with a continued cough or other aggravating activities. During the healing period, pain control is very important. Medication is usually given to control pain. Hospitalization or surgery may be needed for more severe injuries, such as those in which multiple ribs are broken or the ribs have moved out of place.  HOME CARE INSTRUCTIONS   Avoid strenuous activity and any activities or movements that cause pain. Be careful during activities and avoid bumping the injured rib.  Gradually increase activity as directed by your caregiver.  Only take over-the-counter or prescription medications as directed by your caregiver. Do not take other medications without asking your caregiver first.  Apply ice to the injured area for the first 1-2 days after you have been treated or as directed by your caregiver.  Applying ice helps to reduce inflammation and pain.  Put ice in a plastic bag.  Place a towel between your skin and the bag.   Leave the ice on for 15-20 minutes at a time, every 2 hours while you are awake.  Perform deep breathing as directed by your caregiver. This will help prevent pneumonia, which is a common complication of a broken rib. Your caregiver may instruct you  to:  Take deep breaths several times a day.  Try to cough several times a day, holding a pillow against the injured area.  Use a device called an incentive spirometer to practice deep breathing several times a day.  Drink enough fluids to keep your urine clear or pale yellow. This will help you avoid constipation.   Do not wear a rib belt or binder. These restrict breathing, which can lead to pneumonia.  SEEK IMMEDIATE MEDICAL CARE IF:   You have a fever.   You have difficulty breathing or shortness of breath.   You develop a continual cough, or you cough up thick or bloody sputum.  You feel sick to your stomach (nausea), throw up (vomit), or have abdominal pain.   You have worsening pain not controlled with medications.  MAKE SURE YOU:  Understand these instructions.  Will watch your condition.  Will get help right away if you are not doing well or get worse.   This information is not intended to replace advice given to you by your health care provider. Make sure you discuss any questions you have with your health care provider.   Document Released: 05/25/2005 Document Revised: 01/25/2013 Document Reviewed: 07/27/2012 Elsevier Interactive Patient Education 2016 Elsevier Inc.  Blunt Chest Trauma Blunt chest trauma is an injury caused by a blow to the chest. These chest injuries can be very painful. Blunt chest trauma often results in bruised or broken (fractured) ribs. Most cases of bruised and fractured ribs from blunt chest traumas get better after 1 to 3 weeks of rest and pain medicine. Often, the soft tissue in the chest wall is also injured, causing pain and bruising. Internal organs, such as the heart and lungs, may also be injured. Blunt chest trauma can lead to serious medical problems. This injury requires immediate medical care. CAUSES   Motor vehicle collisions.  Falls.  Physical violence.  Sports injuries. SYMPTOMS   Chest pain. The pain may be worse  when you move or breathe deeply.  Shortness of breath.  Lightheadedness.  Bruising.  Tenderness.  Swelling. DIAGNOSIS  Your caregiver will do a physical exam. X-rays may be taken to look for fractures. However, minor rib fractures may not show up on X-rays until a few days after the injury. If a more serious injury is suspected, further imaging tests may be done. This may include ultrasounds, computed tomography (CT) scans, or magnetic resonance imaging (MRI). TREATMENT  Treatment depends on the severity of your injury. Your caregiver may prescribe pain medicines and deep breathing exercises. HOME CARE INSTRUCTIONS  Limit your activities until you can move around without much pain.  Do not do any strenuous work until your injury is healed.  Put ice on the injured area.  Put ice in a plastic bag.  Place a towel between your skin and the bag.  Leave the ice on for 15-20 minutes, 03-04 times a day.  You may wear a rib belt as directed by your caregiver to reduce pain.  Practice deep breathing as directed by your caregiver to keep  your lungs clear.  Only take over-the-counter or prescription medicines for pain, fever, or discomfort as directed by your caregiver. SEEK IMMEDIATE MEDICAL CARE IF:   You have increasing pain or shortness of breath.  You cough up blood.  You have nausea, vomiting, or abdominal pain.  You have a fever.  You feel dizzy, weak, or you faint. MAKE SURE YOU:  Understand these instructions.  Will watch your condition.  Will get help right away if you are not doing well or get worse.   This information is not intended to replace advice given to you by your health care provider. Make sure you discuss any questions you have with your health care provider.   Document Released: 07/02/2004 Document Revised: 06/15/2014 Document Reviewed: 11/21/2014 Elsevier Interactive Patient Education [employer]2016 Elsevier Inc.

## 2015-09-28 NOTE — ED Notes (Signed)
Incentive spirometry provided, family verbalizes and demonstrated comprehension of instructions.

## 2016-01-31 ENCOUNTER — Observation Stay: Payer: Medicare Other

## 2016-01-31 ENCOUNTER — Emergency Department: Payer: Medicare Other

## 2016-01-31 ENCOUNTER — Encounter: Payer: Self-pay | Admitting: Emergency Medicine

## 2016-01-31 ENCOUNTER — Inpatient Hospital Stay
Admission: EM | Admit: 2016-01-31 | Discharge: 2016-02-03 | DRG: 552 | Disposition: A | Discharge status: Home Health | Payer: Medicare Other | Attending: Internal Medicine | Admitting: Internal Medicine

## 2016-01-31 DIAGNOSIS — I110 Hypertensive heart disease with heart failure: Secondary | ICD-10-CM | POA: Diagnosis present

## 2016-01-31 DIAGNOSIS — M545 Low back pain: Secondary | ICD-10-CM | POA: Diagnosis not present

## 2016-01-31 DIAGNOSIS — R531 Weakness: Secondary | ICD-10-CM

## 2016-01-31 DIAGNOSIS — Y92009 Unspecified place in unspecified non-institutional (private) residence as the place of occurrence of the external cause: Secondary | ICD-10-CM | POA: Diagnosis not present

## 2016-01-31 DIAGNOSIS — D649 Anemia, unspecified: Secondary | ICD-10-CM | POA: Diagnosis present

## 2016-01-31 DIAGNOSIS — I10 Essential (primary) hypertension: Secondary | ICD-10-CM

## 2016-01-31 DIAGNOSIS — Z7984 Long term (current) use of oral hypoglycemic drugs: Secondary | ICD-10-CM

## 2016-01-31 DIAGNOSIS — R296 Repeated falls: Secondary | ICD-10-CM | POA: Diagnosis not present

## 2016-01-31 DIAGNOSIS — E876 Hypokalemia: Secondary | ICD-10-CM | POA: Diagnosis present

## 2016-01-31 DIAGNOSIS — R4182 Altered mental status, unspecified: Secondary | ICD-10-CM | POA: Diagnosis present

## 2016-01-31 DIAGNOSIS — W19XXXA Unspecified fall, initial encounter: Secondary | ICD-10-CM | POA: Diagnosis not present

## 2016-01-31 DIAGNOSIS — S2243XA Multiple fractures of ribs, bilateral, initial encounter for closed fracture: Secondary | ICD-10-CM | POA: Diagnosis present

## 2016-01-31 DIAGNOSIS — Z7952 Long term (current) use of systemic steroids: Secondary | ICD-10-CM

## 2016-01-31 DIAGNOSIS — R9431 Abnormal electrocardiogram [ECG] [EKG]: Secondary | ICD-10-CM

## 2016-01-31 DIAGNOSIS — M549 Dorsalgia, unspecified: Secondary | ICD-10-CM

## 2016-01-31 DIAGNOSIS — I509 Heart failure, unspecified: Secondary | ICD-10-CM | POA: Diagnosis not present

## 2016-01-31 DIAGNOSIS — E119 Type 2 diabetes mellitus without complications: Secondary | ICD-10-CM | POA: Diagnosis present

## 2016-01-31 DIAGNOSIS — Z7982 Long term (current) use of aspirin: Secondary | ICD-10-CM

## 2016-01-31 DIAGNOSIS — E1159 Type 2 diabetes mellitus with other circulatory complications: Secondary | ICD-10-CM

## 2016-01-31 LAB — CBC WITH DIFFERENTIAL/PLATELET
BASOS ABS: 0 10*3/uL (ref 0–0.1)
Basophils Relative: 1 %
Eosinophils Absolute: 0 10*3/uL (ref 0–0.7)
Eosinophils Relative: 0 %
HEMATOCRIT: 34.7 % — AB (ref 40.0–52.0)
HEMOGLOBIN: 11.9 g/dL — AB (ref 13.0–18.0)
LYMPHS PCT: 11 %
Lymphs Abs: 0.8 10*3/uL — ABNORMAL LOW (ref 1.0–3.6)
MCH: 27.6 pg (ref 26.0–34.0)
MCHC: 34.3 g/dL (ref 32.0–36.0)
MCV: 80.6 fL (ref 80.0–100.0)
MONOS PCT: 9 %
Monocytes Absolute: 0.6 10*3/uL (ref 0.2–1.0)
NEUTROS ABS: 5.6 10*3/uL (ref 1.4–6.5)
NEUTROS PCT: 79 %
Platelets: 185 10*3/uL (ref 150–440)
RBC: 4.31 MIL/uL — ABNORMAL LOW (ref 4.40–5.90)
RDW: 15.3 % — ABNORMAL HIGH (ref 11.5–14.5)
WBC: 7 10*3/uL (ref 3.8–10.6)

## 2016-01-31 LAB — URINALYSIS COMPLETE WITH MICROSCOPIC (ARMC ONLY)
BACTERIA UA: NONE SEEN
BILIRUBIN URINE: NEGATIVE
Glucose, UA: 50 mg/dL — AB
LEUKOCYTES UA: NEGATIVE
Nitrite: NEGATIVE
PH: 7 (ref 5.0–8.0)
Protein, ur: NEGATIVE mg/dL
Specific Gravity, Urine: 1.011 (ref 1.005–1.030)

## 2016-01-31 LAB — TROPONIN I
TROPONIN I: 0.03 ng/mL — AB (ref <0.03)
TROPONIN I: 0.03 ng/mL — AB (ref <0.03)
TROPONIN I: 0.04 ng/mL — AB (ref <0.03)

## 2016-01-31 LAB — GLUCOSE, CAPILLARY
GLUCOSE-CAPILLARY: 161 mg/dL — AB (ref 65–99)
GLUCOSE-CAPILLARY: 142 mg/dL — AB (ref 65–99)
Glucose-Capillary: 156 mg/dL — ABNORMAL HIGH (ref 65–99)

## 2016-01-31 LAB — COMPREHENSIVE METABOLIC PANEL
ALBUMIN: 3.6 g/dL (ref 3.5–5.0)
ALT: 9 U/L — ABNORMAL LOW (ref 17–63)
AST: 16 U/L (ref 15–41)
Alkaline Phosphatase: 72 U/L (ref 38–126)
Anion gap: 7 (ref 5–15)
BILIRUBIN TOTAL: 0.8 mg/dL (ref 0.3–1.2)
BUN: 12 mg/dL (ref 6–20)
CHLORIDE: 100 mmol/L — AB (ref 101–111)
CO2: 28 mmol/L (ref 22–32)
Calcium: 8.9 mg/dL (ref 8.9–10.3)
Creatinine, Ser: 0.61 mg/dL (ref 0.61–1.24)
GFR calc Af Amer: >60 mL/min (ref >60)
GFR calc non Af Amer: >60 mL/min (ref >60)
GLUCOSE: 149 mg/dL — AB (ref 65–99)
POTASSIUM: 3.4 mmol/L — AB (ref 3.5–5.1)
Sodium: 135 mmol/L (ref 135–145)
TOTAL PROTEIN: 7.1 g/dL (ref 6.5–8.1)

## 2016-01-31 LAB — MAGNESIUM: MAGNESIUM: 1.6 mg/dL — AB (ref 1.7–2.4)

## 2016-01-31 LAB — TSH
TSH: 1.317 u[IU]/mL (ref 0.350–4.500)
TSH: 1.291 u[IU]/mL (ref 0.350–4.500)

## 2016-01-31 MED ORDER — ONDANSETRON HCL 4 MG/2ML IJ SOLN: 4.0000 mg | Freq: Four times a day (QID) | INTRAMUSCULAR | Status: DC | PRN

## 2016-01-31 MED ORDER — POTASSIUM CHLORIDE 20 MEQ PO PACK
40.0000 meq | PACK | Freq: Once | ORAL | Status: AC
  Administered 2016-01-31: 40 meq via ORAL
  Filled 2016-01-31: qty 2

## 2016-01-31 MED ORDER — ASPIRIN EC 81 MG PO TBEC
81.0000 mg | DELAYED_RELEASE_TABLET | Freq: Every day | ORAL | Status: DC
  Administered 2016-02-01 – 2016-02-03 (×3): 81 mg via ORAL
  Filled 2016-01-31 (×4): qty 1

## 2016-01-31 MED ORDER — INSULIN ASPART 100 UNIT/ML ~~LOC~~ SOLN
3.0000 [IU] | Freq: Three times a day (TID) | SUBCUTANEOUS | Status: DC
  Administered 2016-02-01 – 2016-02-03 (×6): 3 [IU] via SUBCUTANEOUS
  Filled 2016-01-31 (×6): qty 3

## 2016-01-31 MED ORDER — NITROGLYCERIN 2 % TD OINT
TOPICAL_OINTMENT | TRANSDERMAL | Status: AC
  Administered 2016-01-31: 1 [in_us] via TOPICAL
  Filled 2016-01-31: qty 1

## 2016-01-31 MED ORDER — ASPIRIN 81 MG PO CHEW
CHEWABLE_TABLET | ORAL | Status: AC
  Administered 2016-01-31: 324 mg via ORAL
  Filled 2016-01-31: qty 4

## 2016-01-31 MED ORDER — ENOXAPARIN SODIUM 40 MG/0.4ML ~~LOC~~ SOLN
40.0000 mg | SUBCUTANEOUS | Status: DC
  Administered 2016-01-31 – 2016-02-02 (×3): 40 mg via SUBCUTANEOUS
  Filled 2016-01-31 (×3): qty 0.4

## 2016-01-31 MED ORDER — METOPROLOL SUCCINATE ER 25 MG PO TB24
12.5000 mg | ORAL_TABLET | Freq: Every day | ORAL | Status: DC
  Administered 2016-01-31 – 2016-02-03 (×3): 12.5 mg via ORAL
  Filled 2016-01-31 (×5): qty 1

## 2016-01-31 MED ORDER — SODIUM CHLORIDE 0.9% FLUSH
3.0000 mL | Freq: Two times a day (BID) | INTRAVENOUS | Status: DC
  Administered 2016-01-31 – 2016-02-03 (×5): 3 mL via INTRAVENOUS

## 2016-01-31 MED ORDER — SODIUM CHLORIDE 0.9 % IV SOLN: 250.0000 mL | INTRAVENOUS | Status: DC | PRN

## 2016-01-31 MED ORDER — FOLIC ACID 1 MG PO TABS
1.0000 mg | ORAL_TABLET | Freq: Every day | ORAL | Status: DC
  Administered 2016-02-01 – 2016-02-03 (×3): 1 mg via ORAL
  Filled 2016-01-31 (×3): qty 1

## 2016-01-31 MED ORDER — ASPIRIN EC 81 MG PO TBEC: 81.0000 mg | DELAYED_RELEASE_TABLET | Freq: Every day | ORAL | Status: DC

## 2016-01-31 MED ORDER — ACETAMINOPHEN 650 MG RE SUPP
650.0000 mg | Freq: Four times a day (QID) | RECTAL | Status: DC | PRN
Start: 2016-01-31 — End: 2016-02-03

## 2016-01-31 MED ORDER — NITROGLYCERIN 2 % TD OINT
1.0000 [in_us] | TOPICAL_OINTMENT | Freq: Four times a day (QID) | TRANSDERMAL | Status: DC
  Administered 2016-01-31: 1 [in_us] via TOPICAL
  Filled 2016-01-31: qty 1

## 2016-01-31 MED ORDER — SODIUM CHLORIDE 0.9% FLUSH: 3.0000 mL | INTRAVENOUS | Status: DC | PRN

## 2016-01-31 MED ORDER — GLIMEPIRIDE 2 MG PO TABS
2.0000 mg | ORAL_TABLET | Freq: Every day | ORAL | Status: DC
  Administered 2016-02-01 – 2016-02-03 (×3): 2 mg via ORAL
  Filled 2016-01-31 (×3): qty 1

## 2016-01-31 MED ORDER — LABETALOL HCL 5 MG/ML IV SOLN
10.0000 mg | INTRAVENOUS | Status: DC | PRN
Start: 2016-01-31 — End: 2016-02-03
  Administered 2016-02-01: 10 mg via INTRAVENOUS
  Filled 2016-01-31: qty 4

## 2016-01-31 MED ORDER — DOCUSATE SODIUM 100 MG PO CAPS
100.0000 mg | ORAL_CAPSULE | Freq: Two times a day (BID) | ORAL | Status: DC
  Administered 2016-01-31 – 2016-02-03 (×6): 100 mg via ORAL
  Filled 2016-01-31 (×6): qty 1

## 2016-01-31 MED ORDER — INSULIN ASPART 100 UNIT/ML ~~LOC~~ SOLN: 0.0000 [IU] | Freq: Every day | SUBCUTANEOUS | Status: DC

## 2016-01-31 MED ORDER — SODIUM CHLORIDE 0.9% FLUSH
3.0000 mL | Freq: Two times a day (BID) | INTRAVENOUS | Status: DC
  Administered 2016-01-31 – 2016-02-03 (×7): 3 mL via INTRAVENOUS

## 2016-01-31 MED ORDER — LISINOPRIL 10 MG PO TABS
10.0000 mg | ORAL_TABLET | Freq: Every day | ORAL | Status: DC
  Administered 2016-02-01 – 2016-02-03 (×3): 10 mg via ORAL
  Filled 2016-01-31 (×3): qty 1

## 2016-01-31 MED ORDER — DOCUSATE SODIUM 100 MG PO CAPS: 100.0000 mg | ORAL_CAPSULE | Freq: Two times a day (BID) | ORAL | Status: DC

## 2016-01-31 MED ORDER — ACETAMINOPHEN 325 MG PO TABS
650.0000 mg | ORAL_TABLET | Freq: Four times a day (QID) | ORAL | Status: DC | PRN
Start: 2016-01-31 — End: 2016-02-03
  Administered 2016-01-31 – 2016-02-01 (×4): 650 mg via ORAL
  Filled 2016-01-31 (×4): qty 2

## 2016-01-31 MED ORDER — LIDOCAINE 5 % EX PTCH
1.0000 | MEDICATED_PATCH | CUTANEOUS | Status: DC
  Administered 2016-01-31 – 2016-02-02 (×3): 1 via TRANSDERMAL
  Filled 2016-01-31 (×4): qty 1

## 2016-01-31 MED ORDER — TRAMADOL HCL 50 MG PO TABS
50.0000 mg | ORAL_TABLET | Freq: Four times a day (QID) | ORAL | Status: DC | PRN
  Administered 2016-02-01 – 2016-02-02 (×2): 50 mg via ORAL
  Filled 2016-01-31 (×2): qty 1

## 2016-01-31 MED ORDER — INSULIN ASPART 100 UNIT/ML ~~LOC~~ SOLN
0.0000 [IU] | Freq: Three times a day (TID) | SUBCUTANEOUS | Status: DC
  Administered 2016-01-31: 2 [IU] via SUBCUTANEOUS
  Administered 2016-02-01: 1 [IU] via SUBCUTANEOUS
  Administered 2016-02-01: 2 [IU] via SUBCUTANEOUS
  Administered 2016-02-02: 3 [IU] via SUBCUTANEOUS
  Administered 2016-02-02: 1 [IU] via SUBCUTANEOUS
  Administered 2016-02-03: 2 [IU] via SUBCUTANEOUS
  Filled 2016-01-31: qty 2
  Filled 2016-01-31: qty 3
  Filled 2016-01-31 (×2): qty 2
  Filled 2016-01-31 (×2): qty 1

## 2016-01-31 MED ORDER — LISINOPRIL 10 MG PO TABS
10.0000 mg | ORAL_TABLET | Freq: Once | ORAL | Status: AC
  Administered 2016-01-31: 10 mg via ORAL
  Filled 2016-01-31 (×2): qty 1

## 2016-01-31 MED ORDER — PRAVASTATIN SODIUM 40 MG PO TABS
40.0000 mg | ORAL_TABLET | Freq: Every day | ORAL | Status: DC
  Administered 2016-02-01 – 2016-02-02 (×2): 40 mg via ORAL
  Filled 2016-01-31 (×2): qty 1

## 2016-01-31 MED ORDER — ONDANSETRON HCL 4 MG PO TABS: 4.0000 mg | ORAL_TABLET | Freq: Four times a day (QID) | ORAL | Status: DC | PRN

## 2016-01-31 MED ORDER — IOPAMIDOL (ISOVUE-370) INJECTION 76%
100.0000 mL | Freq: Once | INTRAVENOUS | Status: AC | PRN
  Administered 2016-01-31: 100 mL via INTRAVENOUS

## 2016-01-31 MED ORDER — ASPIRIN 81 MG PO CHEW
324.0000 mg | CHEWABLE_TABLET | Freq: Once | ORAL | Status: AC
  Administered 2016-01-31: 324 mg via ORAL

## 2016-01-31 MED ORDER — COLCHICINE 0.6 MG PO TABS: 0.6000 mg | ORAL_TABLET | Freq: Every day | ORAL | Status: DC

## 2016-01-31 MED ORDER — SENNA 8.6 MG PO TABS
1.0000 | ORAL_TABLET | Freq: Two times a day (BID) | ORAL | Status: DC
  Administered 2016-01-31 – 2016-02-03 (×6): 8.6 mg via ORAL
  Filled 2016-01-31 (×6): qty 1

## 2016-01-31 MED ORDER — NITROGLYCERIN 2 % TD OINT
1.0000 [in_us] | TOPICAL_OINTMENT | Freq: Once | TRANSDERMAL | Status: AC
  Administered 2016-01-31: 1 [in_us] via TOPICAL

## 2016-01-31 NOTE — ED Notes (Signed)
MD at bedside. 

## 2016-01-31 NOTE — Care Management Obs Status (Signed)
MEDICARE OBSERVATION STATUS NOTIFICATION   Patient Details  Name: Frank SalenCurtis Bovenzi MRN: [id number] Date of Birth: 05/16/1914   Medicare Observation Status Notification Given:   Yes    Berna BueCheryl Shaquille Janes, RN 01/31/2016, 1:40 PM

## 2016-01-31 NOTE — Consult Note (Signed)
[facility] CARDIOLOGY [facility]  CARDIOLOGY CONSULT NOTE  Patient ID: Frank Delgado MRN: [id number] DOB/AGE: July 23, 1913 82 y.o.  Admit date: 01/31/2016 Referring Physician Dr. Winona Legato Primary Physician Dr. Juel Burrow Primary Cardiologist   Reason for Consultation abnormal ekg  HPI: Pt is a 82 yo male who presented to the er for evaulaiton of multiple falls over the past several days. He complained of severe back pain, right and left lateral rib pain. EKG was done which revealed probable 1-2 mm st elevation in the lateral leads and inferior st depressions. CXR revealed right basilar atelectasis and a small right pleural effusion. Head ct was unremarkable for significant acute abnormalities. Initial serum troponin was 0/03. Total ck was elevated somewhat. He has histor of dm, hypertension,chf and denies chest pain. He is hemodynamically stable. His family states he is alone at hoome from 2-10 most days. He ambulates with a walker but some times fails to use it. He denies any syncope.   Review of Systems  HENT: Positive for hearing loss.   Eyes: Negative.   Respiratory: Negative.   Cardiovascular: Negative.   Gastrointestinal: Negative.   Genitourinary: Negative.   Musculoskeletal: Positive for back pain, falls, joint pain and myalgias.  Skin: Negative.   Neurological: Positive for weakness.  Endo/Heme/Allergies: Negative.   Psychiatric/Behavioral: Negative.   All other systems reviewed and are negative.   Past Medical History:  Diagnosis Date  . CHF (congestive heart failure) (HCC)   . Diabetes mellitus without complication (HCC)   . Hypertension     No family history on file.  Social History   Social History  . Marital status: Widowed    Spouse name: N/A  . Number of children: N/A  . Years of education: N/A   Occupational History  . Not on file.   Social History Main Topics  . Smoking status: Never Smoker  . Smokeless tobacco: Never Used  . Alcohol use No   . Drug use: Unknown  . Sexual activity: Not on file   Other Topics Concern  . Not on file   Social History Narrative  . No narrative on file    Past Surgical History:  Procedure Laterality Date  . HIP SURGERY    . STOMACH SURGERY       Prescriptions Prior to Admission  Medication Sig Dispense Refill Last Dose  . aspirin EC 81 MG EC tablet Take 1 tablet (81 mg total) by mouth daily. 30 tablet 0 01/31/2016 at Unknown time  . docusate sodium (COLACE) 100 MG capsule Take 1 capsule (100 mg total) by mouth 2 (two) times daily. 10 capsule 0 01/31/2016 at Unknown time  . folic acid (FOLVITE) 1 MG tablet Take 1 tablet (1 mg total) by mouth daily. 30 tablet 0 01/31/2016 at Unknown time  . furosemide (LASIX) 40 MG tablet Take 40 mg by mouth daily.    01/31/2016 at Unknown time  . glimepiride (AMARYL) 2 MG tablet Take 2 mg by mouth daily with breakfast.    01/31/2016 at Unknown time  . lisinopril (PRINIVIL,ZESTRIL) 10 MG tablet Take 10 mg by mouth daily.    01/31/2016 at Unknown time  . pravastatin (PRAVACHOL) 40 MG tablet Take 40 mg by mouth at bedtime.    01/31/2016 at Unknown time  . traMADol (ULTRAM) 50 MG tablet Take 1-2 tablets by mouth every 6 hours as needed for moderate to severe pain 20 tablet 0 prn at prn  . colchicine 0.6 MG tablet Take 1 tablet (0.6  mg total) by mouth daily. For 10 days then stop (Patient not taking: Reported on 01/31/2016) 10 tablet 0 Not Taking at Unknown time  . Multiple Vitamin (MULTIVITAMIN WITH MINERALS) TABS tablet Take 1 tablet by mouth daily. (Patient not taking: Reported on 01/31/2016) 30 tablet 0 Not Taking at Unknown time  . potassium chloride (K-DUR) 10 MEQ tablet Take 10 mEq by mouth daily.    Not Taking at Unknown time  . predniSONE (DELTASONE) 10 MG tablet Start 50 mg po daily and then taper by 10 mg po daily then stop (Patient not taking: Reported on 01/31/2016) 15 tablet 0 Not Taking at Unknown time    Physical Exam: Blood pressure (!) 189/84, pulse 65,  temperature 97.8 F (36.6 C), resp. rate 16, height 5\' 8"  (1.727 m), weight 61 kg (134 lb 8 oz), SpO2 100 %.   Wt Readings from Last 1 Encounters:  01/31/16 61 kg (134 lb 8 oz)     General appearance: alert Head: Normocephalic, without obvious abnormality, scalp contusion Back: symmetric, no curvature. ROM normal. No CVA tenderness. Resp: clear to auscultation bilaterally Chest wall: right sided chest wall tenderness, left sided chest wall tenderness Cardio: regular rate and rhythm GI: soft, non-tender; bowel sounds normal; no masses,  no organomegaly Extremities: tenderness on elbows Neurologic: Grossly normal  Labs:   Lab Results  Component Value Date   WBC 7.0 01/31/2016   HGB 11.9 (L) 01/31/2016   HCT 34.7 (L) 01/31/2016   MCV 80.6 01/31/2016   PLT 185 01/31/2016    Recent Labs Lab 01/31/16 1120  NA 135  K 3.4*  CL 100*  CO2 28  BUN 12  CREATININE 0.61  CALCIUM 8.9  PROT 7.1  BILITOT 0.8  ALKPHOS 72  ALT 9*  AST 16  GLUCOSE 149*   Lab Results  Component Value Date   CKTOTAL 440 (H) 03/30/2012   CKMB 4.3 (H) 03/28/2012   TROPONINI 0.03 (HH) 01/31/2016      Radiology: no acute cardiopulmonary disease EKG: nsr with 1-2 mm st elevation in high lateral leads.   ASSESSMENT AND PLAN:  Pt is 82 yo with admission after presenting with multiple falls and rib fractures bilaterally. ST changes on ekg are concerning for ischemia but given risk for invasive evaluation and intervention and lack of chest pain and hemodynamic instability, will defer invasive evaluaiton. Will trend serum troponin. Treat medically with asa, emperic nitrates and low dose beta blockers as tolerated. Wil need social services evaluaiton regarding fall prevention at home.  Signed: Dalia HeadingFATH,Sanye Ledesma A. MD, [facility] 01/31/2016, 6:10 PM

## 2016-01-31 NOTE — ED Provider Notes (Signed)
[facility] Emergency Department Provider Note   ____________________________________________   First MD Initiated Contact with Patient 01/31/16 1106     (approximate)  I have reviewed the triage vital signs and the nursing notes.   HISTORY  Chief Complaint Fall    HPI Frank Delgado is a 82 y.o. male patient is had 4 falls in the last few weeks. First fall was at home seemed to hit the front of his head had an abrasion there but was lying on his back. Since then he really hasn't been speaking as clearly as usual and has seemed to be not himself little bit slower than usual. He seems to be complaining of pain and stiffness everywhere. Patient fell again several times last one on Monday and now is complaining of some pain on the right side of his chest. He had an x-ray of his chest today which was read as normal but rib films done on the 22nd showed several left-sided rib fractures. Grandson and son both described the patient is being very sharp normally.Family also reports he has not been taking his medicines lately.   Past Medical History:  Diagnosis Date  . CHF (congestive heart failure) (HCC)   . Diabetes mellitus without complication (HCC)   . Hypertension     Patient Active Problem List   Diagnosis Date Noted  . Anasarca 06/17/2015    Past Surgical History:  Procedure Laterality Date  . HIP SURGERY    . STOMACH SURGERY      Prior to Admission medications   Medication Sig Start Date End Date Taking? Authorizing Provider  aspirin EC 81 MG EC tablet Take 1 tablet (81 mg total) by mouth daily. 06/20/15   Enedina Finner, MD  colchicine 0.6 MG tablet Take 1 tablet (0.6 mg total) by mouth daily. For 10 days then stop 06/20/15   Enedina Finner, MD  docusate sodium (COLACE) 100 MG capsule Take 1 capsule (100 mg total) by mouth 2 (two) times daily. 06/20/15   Enedina Finner, MD  folic acid (FOLVITE) 1 MG tablet Take 1 tablet (1 mg total) by mouth daily. 06/20/15    Enedina Finner, MD  furosemide (LASIX) 40 MG tablet Take 40 mg by mouth daily.     Historical Provider, MD  glimepiride (AMARYL) 2 MG tablet Take 2 mg by mouth daily with breakfast.     Historical Provider, MD  lisinopril (PRINIVIL,ZESTRIL) 10 MG tablet Take 10 mg by mouth daily.     Historical Provider, MD  Multiple Vitamin (MULTIVITAMIN WITH MINERALS) TABS tablet Take 1 tablet by mouth daily. 06/20/15   Enedina Finner, MD  potassium chloride (K-DUR) 10 MEQ tablet Take 10 mEq by mouth daily.     Historical Provider, MD  pravastatin (PRAVACHOL) 40 MG tablet Take 40 mg by mouth at bedtime.     Historical Provider, MD  predniSONE (DELTASONE) 10 MG tablet Start 50 mg po daily and then taper by 10 mg po daily then stop 06/20/15   Enedina Finner, MD  traMADol Janean Sark) 50 MG tablet Take 1-2 tablets by mouth every 6 hours as needed for moderate to severe pain 09/28/15   Loleta Rose, MD    Allergies Review of patient's allergies indicates no known allergies.  No family history on file.  Social History Social History  Substance Use Topics  . Smoking status: Never Smoker  . Smokeless tobacco: Never Used  . Alcohol use No    Review of Systems Constitutional: No fever/chills Eyes: No  visual changes. ENT: No sore throat. Cardiovascular: Denies chest pain. Respiratory: Denies shortness of breath. Gastrointestinal: No abdominal pain.  No nausea, no vomiting.  No diarrhea.  No constipation. Genitourinary: Negative for dysuria. Musculoskeletal: Negative for back pain. Skin: Negative for rash. Neurological: Negative for headaches, focal weakness or numbness.  10-point ROS otherwise negative.  ____________________________________________   PHYSICAL EXAM:  VITAL SIGNS: ED Triage Vitals  Enc Vitals Group     BP 01/31/16 0917 (!) 186/89     Pulse Rate 01/31/16 0917 79     Resp 01/31/16 0917 18     Temp 01/31/16 0917 97.8 F (36.6 C)     Temp Source 01/31/16 0917 Oral     SpO2 01/31/16 0917 98 %      Weight 01/31/16 0908 130 lb (59 kg)     Height 01/31/16 0908 5\' 8"  (1.727 m)     Head Circumference --      Peak Flow --      Pain Score 01/31/16 0908 10     Pain Loc --      Pain Edu? --      Excl. in GC? --     Constitutional: Alert and oriented. Well appearing and in no acute distress. Eyes: Conjunctivae are normal. PERRL. EOMI. Head: Atraumatic. Ears TMs are secured by wax Nose: No congestion/rhinnorhea. Mouth/Throat: Mucous membranes are moist.  Oropharynx non-erythematous. Neck: No stridor.  Cardiovascular: Normal rate, regular rhythm. Grossly normal heart sounds.  Good peripheral circulation. Respiratory: Normal respiratory effort.  No retractions. Lungs CTAB. Gastrointestinal: Soft and nontender. No distention. No abdominal bruits. No CVA tenderness. Musculoskeletal: No lower extremity tenderness nor edema.  No joint effusions. Neurologic:  Patient speech is difficult to understand and family again reports this is new No Other gross focal neurologic deficits are appreciated. Patient does appear to be grossly diffusely weak  Skin:  Skin is warm, dry and intact. No rash noted. Psychiatric: Mood and affect are normal. Speech and behavior are normal.  ____________________________________________   LABS (all labs ordered are listed, but only abnormal results are displayed)  Labs Reviewed  URINALYSIS COMPLETEWITH MICROSCOPIC ([facility] ONLY) - Abnormal; Notable for the following:       Result Value   Color, Urine YELLOW (*)    APPearance CLEAR (*)    Glucose, UA 50 (*)    Ketones, ur 1+ (*)    Hgb urine dipstick 1+ (*)    Squamous Epithelial / LPF 0-5 (*)    All other components within normal limits  COMPREHENSIVE METABOLIC PANEL - Abnormal; Notable for the following:    Potassium 3.4 (*)    Chloride 100 (*)    Glucose, Bld 149 (*)    All other components within normal limits  CBC WITH DIFFERENTIAL/PLATELET - Abnormal; Notable for the following:    RBC 4.31 (*)     Hemoglobin 11.9 (*)    HCT 34.7 (*)    RDW 15.3 (*)    Lymphs Abs 0.8 (*)    All other components within normal limits  TROPONIN I - Abnormal; Notable for the following:    Troponin I 0.03 (*)    All other components within normal limits  CBC WITH DIFFERENTIAL/PLATELET  TSH   ____________________________________________  EKG  EKG read and interpreted by me shows normal sinus rhythm as I remember normal axis there is ST elevation in lead 1 and lead L with ST segment depression and T-wave inversion inferiorly. The computer is reading it as an acute  MI and admits to criteria. Patient has no chest pain at present. I called Dr. Roxan DieselFathi came down reviewed the EKG and repeat and agrees with me that it does meet criteria for acute MI. He advises would not catheter the patient if his age with no chest pain and no other symptoms and a troponin is only 0.03. He advises we should admit the patient and watch him carefully. ____________________________________________  RADIOLOGY  CLINICAL DATA:  Frequent falls  EXAM: CT HEAD WITHOUT CONTRAST  TECHNIQUE: Contiguous axial images were obtained from the base of the skull through the vertex without intravenous contrast.  COMPARISON:  None.  FINDINGS: Brain: No evidence of acute infarction, hemorrhage, hydrocephalus, extra-axial collection or mass lesion/mass effect. Mild atrophic and chronic white matter ischemic changes are noted. Small lacunar infarct is noted in the deep white matter on the left best seen on image number 30 of series 6.  Vascular: No hyperdense vessel or unexpected calcification.  Skull: No acute fracture is noted.  Sinuses/Orbits: No acute finding.  IMPRESSION: Atrophic and chronic white matter ischemic changes without acute abnormality   Electronically Signed   By: Alcide CleverMark  Lukens M.D.   On: 01/31/2016 12:37 ____________________________________________   PROCEDURES  Procedure(s) performed:    Procedures  Critical Care performed:   ____________________________________________   INITIAL IMPRESSION / ASSESSMENT AND PLAN / ED COURSE  Pertinent labs & imaging results that were available during my care of the patient were reviewed by me and considered in my medical decision making (see chart for details).    Clinical Course     ____________________________________________   FINAL CLINICAL IMPRESSION(S) / ED DIAGNOSES  Final diagnoses:  Fall at home, initial encounter  Abnormal EKG  Essential hypertension   Other diagnoses include frequent falls Rib fractures seen on x-ray 8/22 Slurry speech Altered mental status   NEW MEDICATIONS STARTED DURING THIS VISIT:  New Prescriptions   No medications on file     Note:  This document was prepared using Dragon voice recognition software and may include unintentional dictation errors.    Arnaldo NatalPaul F Malinda, MD 01/31/16 1330

## 2016-01-31 NOTE — ED Notes (Signed)
Pt resting in bed, family at bedside.

## 2016-01-31 NOTE — H&P (Signed)
[facility]   PATIENT NAME: Frank Delgado    MR#:  [id number]  DATE OF BIRTH:  June 24, 1913  DATE OF ADMISSION:  01/31/2016  PRIMARY CARE PHYSICIAN: Corky Downs, MD   REQUESTING/REFERRING PHYSICIAN:   CHIEF COMPLAINT:   Chief Complaint  Patient presents with  . Fall    HISTORY OF PRESENT ILLNESS: Frank Delgado  is a 82 y.o. male with a known history of Congestive heart failure, diabetes mellitus without complication, hypertension, who presents to the hospital with complaints of fall 4 days ago, severe back pains. On arrival to the hospital patient had EKG done which revealed abnormal ST T-segment in the high lateral leads, T depressions in inferior leads. His labs were remarkable for hypokalemia, elevated troponin to 0.03, mild anemia. Patient was seen by cardiologist, Dr. Lady Gary , who recommended inpatient admission for follow-up. Chest x-ray revealed a right basilar atelectasis and small right pleural effusion, head CT was unremarkable. Hospitalist services were contacted for admission  PAST MEDICAL HISTORY:   Past Medical History:  Diagnosis Date  . CHF (congestive heart failure) (HCC)   . Diabetes mellitus without complication (HCC)   . Hypertension     PAST SURGICAL HISTORY: Past Surgical History:  Procedure Laterality Date  . HIP SURGERY    . STOMACH SURGERY      SOCIAL HISTORY:  Social History  Substance Use Topics  . Smoking status: Never Smoker  . Smokeless tobacco: Never Used  . Alcohol use No    FAMILY HISTORY: The patient is not able to provide due to significant pain and discomfort in the back  DRUG ALLERGIES: No Known Allergies  Review of Systems  Unable to perform ROS: Medical condition  Musculoskeletal: Positive for back pain and falls.    MEDICATIONS AT HOME:  Prior to Admission medications   Medication Sig Start Date End Date Taking? Authorizing Provider  aspirin EC 81 MG EC tablet Take 1 tablet (81 mg  total) by mouth daily. 06/20/15   Enedina Finner, MD  colchicine 0.6 MG tablet Take 1 tablet (0.6 mg total) by mouth daily. For 10 days then stop 06/20/15   Enedina Finner, MD  docusate sodium (COLACE) 100 MG capsule Take 1 capsule (100 mg total) by mouth 2 (two) times daily. 06/20/15   Enedina Finner, MD  folic acid (FOLVITE) 1 MG tablet Take 1 tablet (1 mg total) by mouth daily. 06/20/15   Enedina Finner, MD  furosemide (LASIX) 40 MG tablet Take 40 mg by mouth daily.     Historical Provider, MD  glimepiride (AMARYL) 2 MG tablet Take 2 mg by mouth daily with breakfast.     Historical Provider, MD  lisinopril (PRINIVIL,ZESTRIL) 10 MG tablet Take 10 mg by mouth daily.     Historical Provider, MD  Multiple Vitamin (MULTIVITAMIN WITH MINERALS) TABS tablet Take 1 tablet by mouth daily. 06/20/15   Enedina Finner, MD  potassium chloride (K-DUR) 10 MEQ tablet Take 10 mEq by mouth daily.     Historical Provider, MD  pravastatin (PRAVACHOL) 40 MG tablet Take 40 mg by mouth at bedtime.     Historical Provider, MD  predniSONE (DELTASONE) 10 MG tablet Start 50 mg po daily and then taper by 10 mg po daily then stop 06/20/15   Enedina Finner, MD  traMADol Janean Sark) 50 MG tablet Take 1-2 tablets by mouth every 6 hours as needed for moderate to severe pain 09/28/15   Loleta Rose, MD  PHYSICAL EXAMINATION:   VITAL SIGNS: Blood pressure (!) 177/111, pulse (!) 59, temperature 97.8 F (36.6 C), temperature source Oral, resp. rate 12, height 5\' 8"  (1.727 m), weight 59 kg (130 lb), SpO2 99 %.  GENERAL:  82 y.o.-year-old patient lying in the bed In moderate distress due to pain in the side and back, especially whenever the patient is being moved, even trying setting him up. Severe arthritis in his hands, the knees, all joints EYES: Pupils equal, round, reactive to light and accommodation. No scleral icterus. Extraocular muscles intact.  HEENT: Head atraumatic, normocephalic. Oropharynx and nasopharynx clear.  NECK:  Supple, no jugular venous  distention. No thyroid enlargement, no tenderness.  LUNGS: Normal breath sounds bilaterally, no wheezing, rales,rhonchi or crepitation. No use of accessory muscles of respiration.  CARDIOVASCULAR: S1, S2 normal. No murmurs, rubs, or gallops.  ABDOMEN: Soft, nontender, nondistended. Bowel sounds present. No organomegaly or mass.  EXTREMITIES: No pedal edema, cyanosis, or clubbing. Palpation of her spine and lower thoracic and lumbar region is exquisitely tender plus tenderness on the right side of the chest palpation, including posterior chest and CVA tenderness on percussion on the right, difficulty moving, even sitting up.  NEUROLOGIC: Cranial nerves II through XII are intact. Muscle strength 4/5 in all extremities. Sensation grossly  intact. Gait not checked.  PSYCHIATRIC: The patient is alert and oriented x 3.  SKIN: No obvious rash, lesion, or ulcer.   LABORATORY PANEL:   CBC  Recent Labs Lab 01/31/16 1120  WBC 7.0  HGB 11.9*  HCT 34.7*  PLT 185  MCV 80.6  MCH 27.6  MCHC 34.3  RDW 15.3*  LYMPHSABS 0.8*  MONOABS 0.6  EOSABS 0.0  BASOSABS 0.0   ------------------------------------------------------------------------------------------------------------------  Chemistries   Recent Labs Lab 01/31/16 1120  NA 135  K 3.4*  CL 100*  CO2 28  GLUCOSE 149*  BUN 12  CREATININE 0.61  CALCIUM 8.9  AST PENDING  ALT PENDING  ALKPHOS PENDING  BILITOT PENDING   ------------------------------------------------------------------------------------------------------------------  Cardiac Enzymes  Recent Labs Lab 01/31/16 1120  TROPONINI 0.03*   ------------------------------------------------------------------------------------------------------------------  RADIOLOGY: Dg Chest 2 View  Result Date: 01/31/2016 CLINICAL DATA:  Fall 4 days ago onto right side. Right anterior chest wall pain. EXAM: CHEST  2 VIEW COMPARISON:  09/28/2015 FINDINGS: There is hyperinflation of the  lungs compatible with COPD. Heart is borderline in size. Small right pleural effusion. Right base atelectasis. Left lung is clear. No pneumothorax. No visible rib fracture or acute bony abnormality. IMPRESSION: COPD. Right base atelectasis with small right pleural effusion. No visible rib fracture or pneumothorax. Electronically Signed   By: Charlett NoseKevin  Dover M.D.   On: 01/31/2016 10:57   Ct Head Wo Contrast  Result Date: 01/31/2016 CLINICAL DATA:  Frequent falls EXAM: CT HEAD WITHOUT CONTRAST TECHNIQUE: Contiguous axial images were obtained from the base of the skull through the vertex without intravenous contrast. COMPARISON:  None. FINDINGS: Brain: No evidence of acute infarction, hemorrhage, hydrocephalus, extra-axial collection or mass lesion/mass effect. Mild atrophic and chronic white matter ischemic changes are noted. Small lacunar infarct is noted in the deep white matter on the left best seen on image number 30 of series 6. Vascular: No hyperdense vessel or unexpected calcification. Skull: No acute fracture is noted. Sinuses/Orbits: No acute finding. IMPRESSION: Atrophic and chronic white matter ischemic changes without acute abnormality Electronically Signed   By: Alcide CleverMark  Lukens M.D.   On: 01/31/2016 12:37    EKG: Orders placed or performed during the hospital  encounter of 01/31/16  . ED EKG  . ED EKG   EKG in the emergency room revealed sinus rhythm at 61 bpm, ST elevation in high lateral leads, questionable acute, anteroseptal infarct, age indeterminate, T depressions in inferior leads  IMPRESSION AND PLAN:  Principal Problem:   Back pain Active Problems:   Malignant essential hypertension   Abnormal EKG   Generalized weakness #1. Low back pain, get lumbar spine x-ray, may need CT, if abnormal, initiated Lidoderm patch, tramadol, physical therapy, may need to go to rehabilitation facility, discussed with patient's family extensively, get CT angiogram to rule out aortic dissection #2.  Malignant essential hypertension, possibly pain mediated, continue patient on lisinopril, advance it as needed, add labetalol intravenously as needed   #3. Abnormal EKG, possibly malignant essential hypertension related, continue patient on nitroglycerin topically, aspirin, give low-dose of metoprolol, Lovenox provided CT angiogram to rule out aortic dissection is negative. Check cardiac enzymes, sensory, get echocardiogram. Patient was seen by cardiologist, Dr. Lady Gary, who recommended observation in the hospital. #4. Generalized weakness, physical therapist evaluation  All the records are reviewed and case discussed with ED provider. Management plans discussed with the patient, family and they are in agreement.  CODE STATUS: Code Status History    Date Active Date Inactive Code Status Order ID Comments User Context   06/17/2015  4:06 PM 06/20/2015  6:46 PM Full Code [id number]  Milagros Loll, MD ED       TOTAL TIME TAKING CARE OF THIS PATIENT: 55 minutes.    Katharina Caper M.D on 01/31/2016 at 1:39 PM  Between 7am to 6pm - Pager - [phone] After 6pm go to [email or web address] - password [email or web address] [facility]  [facility]  Office  [phone]  CC: Primary care physician; Corky Downs, MD

## 2016-01-31 NOTE — ED Notes (Signed)
Family at bedside. 

## 2016-01-31 NOTE — ED Notes (Signed)
EKG abnormal, EKG repeated, Dr. Darnelle CatalanMalinda paged cardiology, ASA and nitro past given, Dr. Darnelle CatalanMalinda notified of critical trop of 0.03, pt denies any chest pain, SOB or other symptoms at this time

## 2016-01-31 NOTE — ED Triage Notes (Signed)
Patient presents to the ED with right rib pain post fall on Monday.  Patient's family states patient has fallen about 3 times in the past month.  Patient's family states patient has been "less sharp" than usual.  Patient denies any pain other than the right side of his ribs.  Area is very tender.

## 2016-01-31 NOTE — ED Notes (Signed)
EDP at bedside  

## 2016-01-31 NOTE — Progress Notes (Signed)
Pt. admitted to unit, [id number] from ED, [facility]. Oriented to room, call bell, Ascom phones and staff. Bed in low position. Fall safety plan reviewed, yellow non-skid socks in place, bed alarm on. Full assessment to Epic; skin assessed with Gloris ManchesterLeah Lee, RN. Telemetry box verified with CCMD and Cordelia PenSherry, NT: [id number] . Will continue to monitor.

## 2016-02-01 DIAGNOSIS — M549 Dorsalgia, unspecified: Secondary | ICD-10-CM | POA: Diagnosis not present

## 2016-02-01 DIAGNOSIS — M545 Low back pain: Secondary | ICD-10-CM | POA: Diagnosis not present

## 2016-02-01 LAB — TROPONIN I: TROPONIN I: 0.04 ng/mL — AB (ref <0.03)

## 2016-02-01 LAB — HEMOGLOBIN A1C: Hgb A1c MFr Bld: 7 % — ABNORMAL HIGH (ref 4.0–6.0)

## 2016-02-01 LAB — GLUCOSE, CAPILLARY
GLUCOSE-CAPILLARY: 136 mg/dL — AB (ref 65–99)
GLUCOSE-CAPILLARY: 50 mg/dL — AB (ref 65–99)
GLUCOSE-CAPILLARY: 86 mg/dL (ref 65–99)
GLUCOSE-CAPILLARY: 122 mg/dL — AB (ref 65–99)
Glucose-Capillary: 165 mg/dL — ABNORMAL HIGH (ref 65–99)

## 2016-02-01 LAB — BASIC METABOLIC PANEL
ANION GAP: 12 (ref 5–15)
BUN: 12 mg/dL (ref 6–20)
CALCIUM: 9.3 mg/dL (ref 8.9–10.3)
CO2: 22 mmol/L (ref 22–32)
Chloride: 102 mmol/L (ref 101–111)
Creatinine, Ser: 0.73 mg/dL (ref 0.61–1.24)
GFR calc non Af Amer: >60 mL/min (ref >60)
GLUCOSE: 129 mg/dL — AB (ref 65–99)
POTASSIUM: 3.7 mmol/L (ref 3.5–5.1)
Sodium: 136 mmol/L (ref 135–145)

## 2016-02-01 LAB — CBC
HEMATOCRIT: 37.2 % — AB (ref 40.0–52.0)
HEMOGLOBIN: 12.2 g/dL — AB (ref 13.0–18.0)
MCH: 26.9 pg (ref 26.0–34.0)
MCHC: 32.8 g/dL (ref 32.0–36.0)
MCV: 82.1 fL (ref 80.0–100.0)
Platelets: 199 10*3/uL (ref 150–440)
RBC: 4.54 MIL/uL (ref 4.40–5.90)
RDW: 15.3 % — ABNORMAL HIGH (ref 11.5–14.5)
WBC: 6.4 10*3/uL (ref 3.8–10.6)

## 2016-02-01 NOTE — Progress Notes (Signed)
Hypoglycemic Event  CBG: 50  Treatment: 15 GM carbohydrate snack  Symptoms: None  Follow-up CBG: Time: 4:22 PM CBG Result:86  Possible Reasons for Event: Inadequate meal intake  Comments/MD notified: n/a per protocol, pt asymptomatic    Frank Delgado M

## 2016-02-01 NOTE — Clinical Social Work Note (Signed)
CSW attempted to contact all family members listed in emergency contacts to conduct an assessment with no answer. CSW left voicemail with all parties. CSW will con't to pursue.  Argentina PonderKaren Martha Eleftherios Dudenhoeffer, MSW LCSW-A [phone]

## 2016-02-01 NOTE — Evaluation (Signed)
Occupational Therapy Evaluation Patient Details Name: Frank Delgado MRN: [id number] DOB: 04/30/1914 Today's Date: 02/01/2016    History of Present Illness Pt is a 82 yo male who presented to the ER for evaluation of multiple falls over the past several days with c/o severe back pain, right and left lateral rib pain.    Clinical Impression   82 yo male presents with significant pain and BUE generalized weakness limited pt's ability to participate in bed mobility, functional transfers, and ADL. Pt will benefit from continued skilled OT services in order to address noted functional deficits and minimize falls risk/maximize safety in order to return home. Recommending SNF for additional rehabilitation at this time, pending pt progress during hospital stay.    Follow Up Recommendations  SNF (based on pt progress)    Equipment Recommendations  Other (comment) (continue to assess equipment needs based on pt progress)    Recommendations for Other Services       Precautions / Restrictions Precautions Precautions: Fall Restrictions Weight Bearing Restrictions: No      Mobility Bed Mobility Overal bed mobility: Needs Assistance             General bed mobility comments: pt declined to perform bed mobility during session due to pain, will continue to assess  Transfers                 General transfer comment: transfers deferred due to pain levels, will continue to assess    Balance Overall balance assessment: History of Falls (will continue to assess)                                          ADL Overall ADL's : Needs assistance/impaired   Eating/Feeding Details (indicate cue type and reason): defer to speech evaluation Grooming: Wash/dry face;Oral care;Brushing hair;Set up;Bed level;Wash/dry hands   Upper Body Bathing: Set up;Bed level;Minimal assitance Upper Body Bathing Details (indicate cue type and reason): per clinical judgment Lower Body  Bathing: Set up;Bed level;Moderate assistance Lower Body Bathing Details (indicate cue type and reason): per clinical judgment Upper Body Dressing : Set up;Minimal assistance;Bed level Upper Body Dressing Details (indicate cue type and reason): per clinical judgment Lower Body Dressing: Bed level;Moderate assistance Lower Body Dressing Details (indicate cue type and reason): per clinical judgment   Toilet Transfer Details (indicate cue type and reason): transfers deferred due to pain levels, will continue to assess Toileting- Clothing Manipulation and Hygiene: Maximal assistance;Bed level Toileting - Clothing Manipulation Details (indicate cue type and reason): per clinical judgment   Tub/Shower Transfer Details (indicate cue type and reason): transfers deferred due to pain levels, will continue to assess Functional mobility during ADLs:  (functional mobility deferred due to pain levels, will continue to assess) General ADL Comments: Pt very limited due to pain levels, declined to participate in sup<>sit transfer and ADL tasks, will continue to assess     Vision Vision Assessment?: Yes Eye Alignment: Within Functional Limits Ocular Range of Motion: Within Functional Limits Alignment/Gaze Preference: Within Defined Limits Tracking/Visual Pursuits: Able to track stimulus in all quads without difficulty Convergence: Within functional limits Visual Fields: No apparent deficits   Perception     Praxis      Pertinent Vitals/Pain Pain Assessment: 0-10 Pain Score:  (Pt had difficult time providing a number, but reports pain is "really bad, can't hardly move") Pain Location: back, ribs Pain Descriptors /  Indicators: Grimacing;Guarding Pain Intervention(s): Limited activity within patient's tolerance;Monitored during session     Hand Dominance     Extremity/Trunk Assessment Upper Extremity Assessment Upper Extremity Assessment: Generalized weakness   Lower Extremity Assessment Lower  Extremity Assessment: Defer to PT evaluation       Communication Communication Communication: HOH;Other (comment) (Pt does not have dentures at hospital which makes it slightly difficult to understanding him)   Cognition Arousal/Alertness: Awake/alert Behavior During Therapy: WFL for tasks assessed/performed Overall Cognitive Status: Within Functional Limits for tasks assessed                     General Comments       Exercises       Shoulder Instructions      Home Living Family/patient expects to be discharged to:: Private residence Living Arrangements: Other relatives (Widowed, lives with grandson, has adult children who live in [place] and [place]) Available Help at Discharge: Available PRN/intermittently;Family Type of Home: House       Home Layout: One level     Bathroom Shower/Tub: Tub/shower unit Shower/tub characteristics: Engineer, building services: Standard     Home Equipment: Walker - standard;Adaptive equipment;Shower seat;Grab bars - Administrator Comments: Home living per pt report, need to confirm with family to ensure accuracy/thoroughness      Prior Functioning/Environment Level of Independence: Needs assistance    ADL's / Homemaking Assistance Needed: Pt reports grandson assists with don/doffing socks/shoes, pt reports able to perform dressing, bathing, toileting mod indep        OT Diagnosis: Generalized weakness;Acute pain   OT Problem List: Decreased strength;Pain;Decreased safety awareness;Decreased activity tolerance;Decreased knowledge of use of DME or AE   OT Treatment/Interventions: Self-care/ADL training;Therapeutic exercise;Energy conservation;DME and/or AE instruction;Patient/family education    OT Goals(Current goals can be found in the care plan section) Acute Rehab OT Goals Patient Stated Goal: go home OT Goal Formulation: With patient Time For Goal Achievement:  02/15/16 Potential to Achieve Goals: Good  OT Frequency: Min 1X/week   Barriers to D/C:    continue to assess barriers to d/c with additional information from family/caregivers       Co-evaluation              End of Session    Activity Tolerance: No increased pain;Patient limited by pain Patient left: in bed;with call bell/phone within reach;with bed alarm set   Time: 1140-1206 OT Time Calculation (min): 26 min Charges:  OT General Charges $OT Visit: 1 Procedure OT Evaluation $OT Eval Low Complexity: 1 Procedure G-Codes: OT G-codes **NOT FOR INPATIENT CLASS** Functional Assessment Tool Used: modified barthel index Functional Limitation: Self care Self Care Current Status (O9629): At least 80 percent but less than 100 percent impaired, limited or restricted Self Care Goal Status (B2841): At least 40 percent but less than 60 percent impaired, limited or restricted  Eliezer Bottom, OTR/L 02/01/2016, 12:39 PM

## 2016-02-01 NOTE — [place] FL2 (Signed)
[place] MEDICAID FL2 LEVEL OF CARE SCREENING TOOL     IDENTIFICATION  Patient Name: Frank Delgado Birthdate: 04/17/1914 Sex: male Admission Date (Current Location): 01/31/2016  [place]   Facility and Address:  [facility], [address], [place], [place] [address]      Provider Number: [id number]  Attending Physician Name and Address:  Wyatt Hasteavid K Hower, MD  Relative Name and Phone Number:       Current Level of Care: Hospital Recommended Level of Care: Skilled Nursing Facility Prior Approval Number:    Date Approved/Denied: 03/29/12 PASRR Number: [id number]  Discharge Plan: SNF    Current Diagnoses: Patient Active Problem List   Diagnosis Date Noted  . Back pain 01/31/2016  . Generalized weakness 01/31/2016  . Malignant essential hypertension 01/31/2016  . Abnormal EKG 01/31/2016  . Anasarca 06/17/2015    Orientation RESPIRATION BLADDER Height & Weight     Self  Normal   Weight: 134 lb 3.2 oz (60.9 kg) Height:  5\' 8"  (172.7 cm)  BEHAVIORAL SYMPTOMS/MOOD NEUROLOGICAL BOWEL NUTRITION STATUS           AMBULATORY STATUS COMMUNICATION OF NEEDS Skin   Total Care Verbally Normal                       Personal Care Assistance Level of Assistance  Total care, Dressing, Feeding, Bathing Bathing Assistance: Limited assistance Feeding assistance: Limited assistance Dressing Assistance: Limited assistance Total Care Assistance: Maximum assistance   Functional Limitations Info             SPECIAL CARE FACTORS FREQUENCY                       Contractures      Additional Factors Info                  Current Medications (02/01/2016):  This is the current hospital active medication list Current Facility-Administered Medications  Medication Dose Route Frequency Provider Last Rate Last Dose  . 0.9 %  sodium chloride infusion  250 mL Intravenous PRN Katharina Caperima [place], MD      . acetaminophen  (TYLENOL) tablet 650 mg  650 mg Oral Q6H PRN Katharina Caperima [place], MD   650 mg at 02/01/16 1355   Or  . acetaminophen (TYLENOL) suppository 650 mg  650 mg Rectal Q6H PRN Katharina Caperima [place], MD      . aspirin EC tablet 81 mg  81 mg Oral Daily Katharina Caperima [place], MD   81 mg at 02/01/16 0944  . docusate sodium (COLACE) capsule 100 mg  100 mg Oral BID Katharina Caperima [place], MD   100 mg at 02/01/16 0944  . enoxaparin (LOVENOX) injection 40 mg  40 mg Subcutaneous Q24H Katharina Caperima [place], MD   40 mg at 01/31/16 2148  . folic acid (FOLVITE) tablet 1 mg  1 mg Oral Daily Katharina Caperima [place]ick[place], MD   1 mg at 02/01/16 0944  . glimepiride (AMARYL) tablet 2 mg  2 mg Oral Q breakfast Katharina Caperima [place], MD   2 mg at 02/01/16 0827  . insulin aspart (novoLOG) injection 0-5 Units  0-5 Units Subcutaneous QHS Katharina Caperima [place], MD      . insulin aspart (novoLOG) injection 0-9 Units  0-9 Units Subcutaneous TID WC Katharina Caperima [place], MD   2 Units at 02/01/16 1142  . insulin aspart (novoLOG) injection 3 Units  3 Units Subcutaneous TID WC Katharina Caperima [place]ick[place], MD   3 Units  at 02/01/16 1142  . labetalol (NORMODYNE,TRANDATE) injection 10 mg  10 mg Intravenous Q2H PRN Katharina Caper, MD      . lidocaine (LIDODERM) 5 % 1 patch  1 patch Transdermal Q24H Katharina Caper, MD   1 patch at 01/31/16 1714  . lisinopril (PRINIVIL,ZESTRIL) tablet 10 mg  10 mg Oral Daily Katharina Caper, MD   10 mg at 02/01/16 0941  . metoprolol succinate (TOPROL-XL) 24 hr tablet 12.5 mg  12.5 mg Oral Daily Katharina Caper, MD   12.5 mg at 01/31/16 1714  . ondansetron (ZOFRAN) tablet 4 mg  4 mg Oral Q6H PRN Katharina Caper, MD       Or  . ondansetron (ZOFRAN) injection 4 mg  4 mg Intravenous Q6H PRN Katharina Caper, MD      . pravastatin (PRAVACHOL) tablet 40 mg  40 mg Oral QHS Katharina Caper, MD      . senna (SENOKOT) tablet 8.6 mg  1 tablet Oral BID Katharina Caper, MD   8.6 mg at 02/01/16 0944  . sodium chloride flush (NS) 0.9 % injection 3 mL  3 mL Intravenous Q12H Katharina Caper, MD   3 mL at 02/01/16 0945  . sodium  chloride flush (NS) 0.9 % injection 3 mL  3 mL Intravenous Q12H Katharina Caper, MD   3 mL at 02/01/16 0945  . sodium chloride flush (NS) 0.9 % injection 3 mL  3 mL Intravenous PRN Katharina Caper, MD      . traMADol (ULTRAM) tablet 50 mg  50 mg Oral Q6H PRN Katharina Caper, MD         Discharge Medications: Please see discharge summary for a list of discharge medications.  Relevant Imaging Results:  Relevant Lab Results:   Additional Information   SS [id number] Judi Cong, LCSW

## 2016-02-01 NOTE — Progress Notes (Signed)
[facility] at [facility]   PATIENT NAME: Frank Delgado    MRN#:  [id number]  DATE OF BIRTH:  01/02/1914  SUBJECTIVE:  Hospital Day: 1 day Frank Delgado is a 82 y.o. male presenting with Fall .   Overnight events: No acute overnight events Interval Events: No complaints but seems somewhat confused  REVIEW OF SYSTEMS:  Unable to obtain given patient's mental status DRUG ALLERGIES:  No Known Allergies  VITALS:  Blood pressure (!) 160/88, pulse 61, temperature 97.7 F (36.5 C), temperature source Oral, resp. rate 16, height 5\' 8"  (1.727 m), weight 60.9 kg (134 lb 3.2 oz), SpO2 100 %.  PHYSICAL EXAMINATION:  VITAL SIGNS: Vitals:   02/01/16 0419 02/01/16 0827  BP: (!) 158/87 (!) 160/88  Pulse: 63 61  Resp: 16 16  Temp: 97.5 F (36.4 C) 97.7 F (36.5 C)   GENERAL:82 y.o.male currently in no acute distress.  HEAD: Normocephalic, atraumatic.  EYES: Pupils equal, round, reactive to light. Extraocular muscles intact. No scleral icterus.  MOUTH: Moist mucosal membrane. Dentition intact. No abscess noted.  EAR, NOSE, THROAT: Clear without exudates. No external lesions.  NECK: Supple. No thyromegaly. No nodules. No JVD.  PULMONARY: Clear to ascultation, without wheeze rails or rhonci. No use of accessory muscles, Good respiratory effort. good air entry bilaterally CHEST: Nontender to palpation.  CARDIOVASCULAR: S1 and S2. Regular rate and rhythm. No murmurs, rubs, or gallops. No edema. Pedal pulses 2+ bilaterally.  GASTROINTESTINAL: Soft, nontender, nondistended. No masses. Positive bowel sounds. No hepatosplenomegaly.  MUSCULOSKELETAL: No swelling, clubbing, or edema. Range of motion full in all extremities.  NEUROLOGIC: Cranial nerves II through XII are intact. No gross focal neurological deficits. Sensation intact. Reflexes intact.  SKIN: No ulceration, lesions, rashes, or cyanosis. Skin warm and dry. Turgor intact.  PSYCHIATRIC: Mood, affect  pleasantly confused. The patient is awake, alert and oriented self, does not know the year does not know why in the hospital. Insight, judgment poor.      LABORATORY PANEL:   CBC  Recent Labs Lab 02/01/16 0514  WBC 6.4  HGB 12.2*  HCT 37.2*  PLT 199   ------------------------------------------------------------------------------------------------------------------  Chemistries   Recent Labs Lab 01/31/16 1120 02/01/16 0514  NA 135 136  K 3.4* 3.7  CL 100* 102  CO2 28 22  GLUCOSE 149* 129*  BUN 12 12  CREATININE 0.61 0.73  CALCIUM 8.9 9.3  MG 1.6*  --   AST 16  --   ALT 9*  --   ALKPHOS 72  --   BILITOT 0.8  --    ------------------------------------------------------------------------------------------------------------------  Cardiac Enzymes  Recent Labs Lab 02/01/16 0514  TROPONINI 0.04*   ------------------------------------------------------------------------------------------------------------------  RADIOLOGY:  Dg Chest 2 View  Result Date: 01/31/2016 CLINICAL DATA:  Fall 4 days ago onto right side. Right anterior chest wall pain. EXAM: CHEST  2 VIEW COMPARISON:  09/28/2015 FINDINGS: There is hyperinflation of the lungs compatible with COPD. Heart is borderline in size. Small right pleural effusion. Right base atelectasis. Left lung is clear. No pneumothorax. No visible rib fracture or acute bony abnormality. IMPRESSION: COPD. Right base atelectasis with small right pleural effusion. No visible rib fracture or pneumothorax. Electronically Signed   By: Charlett Nose M.D.   On: 01/31/2016 10:57   Ct Head Wo Contrast  Result Date: 01/31/2016 CLINICAL DATA:  Frequent falls EXAM: CT HEAD WITHOUT CONTRAST TECHNIQUE: Contiguous axial images were obtained from the base of the skull through the vertex without intravenous  contrast. COMPARISON:  None. FINDINGS: Brain: No evidence of acute infarction, hemorrhage, hydrocephalus, extra-axial collection or mass  lesion/mass effect. Mild atrophic and chronic white matter ischemic changes are noted. Small lacunar infarct is noted in the deep white matter on the left best seen on image number 30 of series 6. Vascular: No hyperdense vessel or unexpected calcification. Skull: No acute fracture is noted. Sinuses/Orbits: No acute finding. IMPRESSION: Atrophic and chronic white matter ischemic changes without acute abnormality Electronically Signed   By: Alcide Clever M.D.   On: 01/31/2016 12:37   Ct Cervical Spine Wo Contrast  Result Date: 01/31/2016 CLINICAL DATA:  Neck pain after a fall. Multiple falls over the past few months. EXAM: CT CERVICAL SPINE WITHOUT CONTRAST TECHNIQUE: Multidetector CT imaging of the cervical spine was performed without intravenous contrast. Multiplanar CT image reconstructions were also generated. COMPARISON:  Head CT of earlier today. Cervical spine plain films of 12/21/2006. FINDINGS: Spinal visualization through the bottom of T1. Prevertebral soft tissues are within normal limits. Left worse than right calcified carotid atherosclerosis. No apical pneumothorax. Biapical pleural-parenchymal scarring. Skull base intact. Maintenance of vertebral body height. Straightening and mild reversal of expected cervical lordosis. Facet arthropathy which is worse on the left at C2-3. Marked degenerative changes about the C1-2 level. most significant on the right. IMPRESSION: 1. Marked spondylosis, without acute fracture subluxation. 2. Straightening and mild reversal of expected cervical lordosis could be positional, due to muscular spasm. Ligamentous injury cannot be excluded. Electronically Signed   By: Jeronimo Greaves M.D.   On: 01/31/2016 14:05   Ct Angio Chest/abd/pel For Dissection W And/or W/wo  Result Date: 01/31/2016 CLINICAL DATA:  Chest pain for 4 days. EXAM: CT CHEST, ABDOMEN, AND PELVIS WITH CONTRAST TECHNIQUE: Multidetector CT imaging of the chest, abdomen and pelvis was performed following the  standard protocol during bolus administration of intravenous contrast. Precontrast imaging through the chest was also obtained. CONTRAST:  100 cc Isovue 370 COMPARISON:  CT abdomen and pelvis dated 12/02/2007 FINDINGS: CT CHEST FINDINGS Cardiovascular: There is no evidence of aortic dissection or intramural hematoma. Maximal diameter of the ascending aorta is 3.4 cm. There is some motion artifact. Maximal diameter of the proximal descending thoracic aorta is 3.6 cm. Mild scattered atherosclerotic calcifications of the aorta are noted. Aberrant right subclavian artery is patent. This is a normal variation. There is some calcified and irregular plaque in the proximal right axillary artery. No obvious significant focal narrowing is present. The right common carotid artery, left common carotid artery, and left subclavian artery are patent. There is atherosclerotic calcified plaque at the origin of both vertebral arteries. They are grossly patent. There are no filling defects in the pulmonary arterial tree to suggest acute pulmonary thromboembolism. Coronary artery calcification in the left main, lad, and circumflex coronary artery distributions is present. Mediastinum/Nodes: The thyroid gland is heterogeneous without obvious nodule. There is no evidence of abnormal mediastinal adenopathy. No pericardial effusion. Lungs/Pleura: Tiny right pleural effusion. No left pleural effusion. No pneumothorax There is dependent atelectasis at the right lung base. Very minimal dependent atelectasis in the left posterior lung. Moderate 2 mm left upper lobe noncalcified nodule on image 26 of series 5. Centrilobular emphysema. Musculoskeletal: No acute rib fracture. Mid thoracic compression deformities have a chronic appearance. No evidence of fracture in the sternum. CT ABDOMEN PELVIS FINDINGS Hepatobiliary: Gallbladder and liver are within normal limits. Pancreas: Atrophic.  No focal mass. Spleen: Calcified granulomata are present.  Adrenals/Urinary Tract: Adrenal glands are somewhat lobulated  without obvious focal mass. There are chronic changes of the kidneys bilaterally. Stomach/Bowel: There is distention of the colon. Moderate stool burden in the ascending colon. No obvious focal mass of the colon is present. No disproportionate dilatation of small bowel. Gas-filled loops of small bowel are upper normal in caliber and likely related to colonic distention. There is no obvious free intraperitoneal gas. Bilateral inguinal hernia containing small bowel loops are no longer visualize compatible with postoperative change from herniorrhaphy. Vascular/Lymphatic: There is no evidence of aortic dissection or aneurysm within the abdomen. Scattered atherosclerotic calcifications are noted. There is irregular plaque at the origin of the celiac axis. There is a web-like area of narrowing 2 cm beyond its origin. Branch vessels are patent. Mild atherosclerotic calcification at the origin of the SMA without significant focal narrowing. There is hypertrophy of pancreatic collateral branches likely providing collateral flow to celiac branches. There is calcified plaque at the origin of the IMA. Beyond its origin, the vessel is patent and diminutive. Two right renal arteries are patent. There is calcific plaque at the origin of the dominant left upper renal artery. There is at least 50% narrowing, 1 cm beyond its origin. The more inferior and more diminutive left renal artery is patent. Right common iliac artery is ectatic with a maximal diameter of 1.4 cm. Diffuse atherosclerotic calcifications are present. No significant focal narrowing in the right common, external, or internal iliac arteries. Left common iliac artery is ectatic with a maximal diameter of 1.8 cm. There are atherosclerotic calcifications throughout the left iliac vasculature. No significant narrowing in the left common, internal, or external iliac arteries. Proximal femoral arteries are  grossly patent. Reproductive: There is noted to be thickening of the wall of the bladder with some calcifications. There is also an abnormality within the very deep pelvis extending into the right ischiorectal fossa. It is difficult to assess Hounsfield unit measurements due to streak artifact but the abnormality measures up to 7.6 cm and is larger than on the prior study. This may represent a bladder diverticulum, however other cystic or masslike abnormality cannot be excluded. Other: There is no free fluid. Musculoskeletal: Advanced multilevel degenerative changes in the lumbar spine are noted. Bilateral L5 pars defects with grade 1 L5-S1 spondylolisthesis. No lumbar vertebral compression deformity is identified. Bilateral total hip arthroplasty. IMPRESSION: There is no evidence of aortic dissection or aneurysm. Coronary artery calcifications are noted. There is significant narrowing in the proximal celiac axis. There is collateral flow role from the SMA via pancreatic branches. There is 50% narrowing of the more dominant superior left renal artery. Right renal arteries are patent. Mild aneurysmal dilatation of the right common iliac artery. 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 Iliac arteries are patent. 2 mm noncalcified left upper lobe pulmonary nodule. No follow-up needed if patient is low-risk. Non-contrast chest CT can be considered in 12 months if patient is high-risk. This recommendation follows the consensus statement: Guidelines for Management of Incidental Pulmonary Nodules Detected on CT Images:From the Fleischner Society 2017; published online before print (10.1148/[id number]). Tiny right pleural effusion with dependent atelectasis at the right base. Chronic appearing mid to upper thoracic compression deformities. There is diffuse bladder wall thickening. This may be related to bladder outlet obstruction. An inflammatory process is not excluded. There is a space  occupying lesion in the deep right pelvis extending into the right ischiorectal fossa. This may represent a bladder diverticulum. Cystogram may be helpful to further characterize this abnormality.  Underlying other cystic or masslike abnormality are not excluded. There is diffuse distention of small and large bowel without disproportionate dilatation. This may be related to constipation or obstruction secondary to the described cystic abnormality above. Grade 1 L5-S1 spondylolisthesis. Electronically Signed   By: Jolaine Click M.D.   On: 01/31/2016 16:00    EKG:   Orders placed or performed during the hospital encounter of 01/31/16  . ED EKG  . ED EKG    ASSESSMENT AND PLAN:   Khallid Pasillas is a 82 y.o. male presenting with Fall . Admitted 01/31/2016 : Day #: 1 day 1. Recurrent falls: Check vitamin D level, physical therapy evaluation 2. Essential hypertension: Lisinopril Patient observation status and we'll get physical therapy likely because of this does not qualify for rehabilitation placement-we'll discuss with case management/social workers  All the records are reviewed and case discussed with Care Management/Social Workerr. Management plans discussed with the patient, family and they are in agreement.  CODE STATUS: full TOTAL TIME TAKING CARE OF THIS PATIENT: 28 minutes.   POSSIBLE D/C IN 1DAYS, DEPENDING ON CLINICAL CONDITION.   Kenyette Gundy,  Mardi Mainland.D on 02/01/2016 at 11:40 AM  Between 7am to 6pm - Pager - [phone]  After 6pm: House Pager: - [phone]  [facility]  Office  [phone]  CC: Primary care physician; Corky Downs, MD

## 2016-02-01 NOTE — Progress Notes (Signed)
Physical Therapy Evaluation Patient Details Name: Kayen Grabel MRN: [id number] DOB: [birth year] Today's Date: 02/01/2016   History of Present Illness  Pt is a 82 yo male who presented to the ER for evaluation of multiple falls over the past several days with c/o severe back pain, right and left lateral rib pain.   Clinical Impression  Patient is 78 and presents with high pain levels with movement and is not able to perform bed mobility from supine to sit due to increased left side rib pain. He has 3/5 strength BLE and BUE . He will benefit from skilled PT to improve mobility and strength and return to prior level of function.     Follow Up Recommendations SNF    Equipment Recommendations  Rolling walker with 5" wheels    Recommendations for Other Services       Precautions / Restrictions Precautions Precautions: Fall Restrictions Weight Bearing Restrictions: No      Mobility  Bed Mobility Overal bed mobility: Needs Assistance Bed Mobility: Supine to Sit;Sit to Supine     Supine to sit: Max assist Sit to supine: Max assist   General bed mobility comments: attempted to get patient to the side of bed and it was too painful to continue  Transfers Overall transfer level:  (NT)               General transfer comment: transfers deferred due to pain levels, will continue to assess  Ambulation/Gait Ambulation/Gait assistance:  (NT due to high pain)              Stairs            Wheelchair Mobility    Modified Rankin (Stroke Patients Only)       Balance Overall balance assessment: History of Falls (will continue to assess)                                           Pertinent Vitals/Pain Pain Assessment: Faces Pain Score: 5  Pain Location: left side ribs Pain Descriptors / Indicators: Aching Pain Intervention(s): Limited activity within patient's tolerance    Home Living Family/patient expects to be discharged to:: Private  residence Living Arrangements: Other relatives Available Help at Discharge: Available PRN/intermittently;Family Type of Home: House       Home Layout: One level Home Equipment: Walker - standard;Adaptive equipment;Shower seat;Grab bars - tub/shower Additional Comments: Home living per pt report, need to confirm with family to ensure accuracy/thoroughness    Prior Function Level of Independence: Needs assistance   Gait / Transfers Assistance Needed: patient reports that he was walking  ADL's / Homemaking Assistance Needed: Pt reports grandson assists with don/doffing socks/shoes, pt reports able to perform dressing, bathing, toileting mod indep  Comments: It appears that patient was ambulating mod independently with RW prior to this admission.      Hand Dominance        Extremity/Trunk Assessment   Upper Extremity Assessment: Overall WFL for tasks assessed           Lower Extremity Assessment: Overall WFL for tasks assessed         Communication   Communication: HOH;Other (comment)  Cognition Arousal/Alertness: Awake/alert Behavior During Therapy: WFL for tasks assessed/performed Overall Cognitive Status: Within Functional Limits for tasks assessed  General Comments      Exercises General Exercises - Lower Extremity Heel Slides: 10 reps Hip ABduction/ADduction: 10 reps      Assessment/Plan    PT Assessment Patient needs continued PT services  PT Diagnosis Difficulty walking   PT Problem List Decreased strength;Decreased activity tolerance;Decreased mobility;Decreased safety awareness;Pain  PT Treatment Interventions Gait training;Therapeutic activities;Therapeutic exercise   PT Goals (Current goals can be found in the Care Plan section) Acute Rehab PT Goals Patient Stated Goal: go home PT Goal Formulation: With patient Time For Goal Achievement: 02/14/16 Potential to Achieve Goals: Fair    Frequency Min 2X/week    Barriers to discharge Decreased caregiver support      Co-evaluation               End of Session   Activity Tolerance: Patient limited by pain Patient left: with bed alarm set      Functional Assessment Tool Used: clinical judgement Functional Limitation: Mobility: Walking and moving around Mobility: Walking and Moving Around Current Status ([id number](G8978): At least 40 percent but less than 60 percent impaired, limited or restricted Mobility: Walking and Moving Around Goal Status [phone](G8979): At least 20 percent but less than 40 percent impaired, limited or restricted    Time: 1300-1330 PT Time Calculation (min) (ACUTE ONLY): 30 min   Charges:   PT Evaluation $PT Eval Low Complexity: 1 Procedure PT Treatments $Therapeutic Exercise: 8-22 mins   PT G Codes:   PT G-Codes **NOT FOR INPATIENT CLASS** Functional Assessment Tool Used: clinical judgement Functional Limitation: Mobility: Walking and moving around Mobility: Walking and Moving Around Current Status (W2956(G8978): At least 40 percent but less than 60 percent impaired, limited or restricted Mobility: Walking and Moving Around Goal Status [phone](G8979): At least 20 percent but less than 40 percent impaired, limited or restricted  Ezekiel InaKristine S Grae Cannata, PT, DPT  [place], Barkley BrunsKristine S 02/01/2016, 2:20 PM

## 2016-02-01 NOTE — Evaluation (Signed)
Clinical/Bedside Swallow Evaluation Patient Details  Name: Frank Delgado MRN: [id number] Date of Birth: 12/16/1913  Today's Date: 02/01/2016 Time: SLP Start Time (ACUTE ONLY): 0845 SLP Stop Time (ACUTE ONLY): 0923 SLP Time Calculation (min) (ACUTE ONLY): 38 min  Past Medical History:  Past Medical History:  Diagnosis Date  . CHF (congestive heart failure) (HCC)   . Diabetes mellitus without complication (HCC)   . Hypertension    Past Surgical History:  Past Surgical History:  Procedure Laterality Date  . HIP SURGERY    . STOMACH SURGERY     HPI:    Per admission note. Frank SalenCurtis Bedel  is a 82 y.o. male with a known history of Congestive heart failure, diabetes mellitus without complication, hypertension, who presents to the hospital with complaints of fall 4 days ago, severe back pains. ST evaluation to assess for Dysphagia and appropriate diet recs.  Assessment / Plan / Recommendation Clinical Impression  Pt presents with mostly functional swallowing at bedside. No s/s of aspiration noted at bedside. Pt had a hoarse somewhat gurgly vocal quality upon ST entyering the room. Given bites of puree and sips of thin liquid by straw, no s/s of aspiration noted. Given bites of solid food, Pt needed extended time to masticate but was eventually able to clear oral cavity. Noted pt is edentulous. Laryngeal elevation appeared adequate and no more changed were noted with vocal quality. With the exception of no teeth, oral cavity and musculature was [facility]. Rec continue with thinliquids and solid foods, although rec mechanical soft consistencies/Dys 3. Will monitor for toleration of diet and adjust as needed. Aspiration precautions discuseed with Pt.    Aspiration Risk  Mild aspiration risk    Diet Recommendation   Dysphagia 3, chopped meats, thin liquids  Medication Administration: Whole meds with puree    Other  Recommendations     Follow up Recommendations  None    Frequency and Duration min  2x/week  1 week       Prognosis Prognosis for Safe Diet Advancement: Good      Swallow Study   General Type of Study: Bedside Swallow Evaluation Diet Prior to this Study: Regular Respiratory Status: Room air History of Recent Intubation: No Behavior/Cognition: Alert;Cooperative Oral Cavity Assessment: Within Functional Limits Oral Care Completed by SLP: No Oral Cavity - Dentition: Edentulous Vision: Functional for self-feeding Self-Feeding Abilities: Needs assist;Able to feed self Patient Positioning: Upright in bed Baseline Vocal Quality: Other (comment);Hoarse Volitional Cough: Strong Volitional Swallow: Able to elicit    Oral/Motor/Sensory Function Overall Oral Motor/Sensory Function: Within functional limits   Ice Chips Ice chips: Not tested Presentation: Self Fed   Thin Liquid Thin Liquid: Within functional limits Presentation: Self Fed    Nectar Thick Nectar Thick Liquid: Not tested Presentation: Self Fed   Honey Thick Honey Thick Liquid: Not tested   Puree Puree: Within functional limits Presentation: Self Fed   Solid   GO   Solid: Impaired Presentation: Self Fed Oral Phase Impairments:  (slow mastication, no dentition) Oral Phase Functional Implications: Prolonged oral transit    Functional Limitations: Swallowing Swallow Current Status (A2130(G8996): At least 1 percent but less than 20 percent impaired, limited or restricted Swallow Goal Status (Q6578(G8997): 0 percent impaired, limited or restricted Swallow Discharge Status [phone]([id number]): 0 percent impaired, limited or restricted   Eather ColasJennifer A Belvie Iribe 02/01/2016,9:25 AM

## 2016-02-02 DIAGNOSIS — M545 Low back pain: Secondary | ICD-10-CM | POA: Diagnosis not present

## 2016-02-02 DIAGNOSIS — M549 Dorsalgia, unspecified: Secondary | ICD-10-CM | POA: Diagnosis not present

## 2016-02-02 LAB — GLUCOSE, CAPILLARY
GLUCOSE-CAPILLARY: 245 mg/dL — AB (ref 65–99)
Glucose-Capillary: 87 mg/dL (ref 65–99)
Glucose-Capillary: 130 mg/dL — ABNORMAL HIGH (ref 65–99)
Glucose-Capillary: 151 mg/dL — ABNORMAL HIGH (ref 65–99)

## 2016-02-02 NOTE — Progress Notes (Signed)
[facility]   PATIENT NAME: Frank SalenCurtis Delgado    MRN#:  [id number]  DATE OF BIRTH:  10/01/1913  SUBJECTIVE:  Delgado Day: 1 day Frank Delgado is a 82 y.o. male presenting with Fall .   Overnight events: No acute overnight events Interval Events: No complaints but seems somewhat confused  REVIEW OF SYSTEMS:  Unable to obtain given patient's mental status DRUG ALLERGIES:  No Known Allergies  VITALS:  Blood pressure 138/83, pulse 78, temperature 97.7 F (36.5 C), temperature source Oral, resp. rate 16, height 5\' 8"  (1.727 m), weight 60.9 kg (134 lb 3.2 oz), SpO2 100 %.  PHYSICAL EXAMINATION:  VITAL SIGNS: Vitals:   02/02/16 0423 02/02/16 0928  BP: (!) 155/71 138/83  Pulse: 61 78  Resp: 18 16  Temp: 97.8 F (36.6 C) 97.7 F (36.5 C)   GENERAL:82 y.o.male currently in no acute distress.  HEAD: Normocephalic, atraumatic.  EYES: Pupils equal, round, reactive to light. Extraocular muscles intact. No scleral icterus.  MOUTH: Moist mucosal membrane. Dentition intact. No abscess noted.  EAR, NOSE, THROAT: Clear without exudates. No external lesions.  NECK: Supple. No thyromegaly. No nodules. No JVD.  PULMONARY: Clear to ascultation, without wheeze rails or rhonci. No use of accessory muscles, Good respiratory effort. good air entry bilaterally CHEST: Nontender to palpation.  CARDIOVASCULAR: S1 and S2. Regular rate and rhythm. No murmurs, rubs, or gallops. No edema. Pedal pulses 2+ bilaterally.  GASTROINTESTINAL: Soft, nontender, nondistended. No masses. Positive bowel sounds. No hepatosplenomegaly.  MUSCULOSKELETAL: No swelling, clubbing, or edema. Range of motion full in all extremities.  NEUROLOGIC: Cranial nerves II through XII are intact. No gross focal neurological deficits. Sensation intact. Reflexes intact.  SKIN: No ulceration, lesions, rashes, or cyanosis. Skin warm and dry. Turgor intact.  PSYCHIATRIC: Mood, affect pleasantly  confused. The patient is awake, alert and oriented self, does not know the year does not know why in the Delgado. Insight, judgment poor.      LABORATORY PANEL:   CBC  Recent Labs Lab 02/01/16 0514  WBC 6.4  HGB 12.2*  HCT 37.2*  PLT 199   ------------------------------------------------------------------------------------------------------------------  Chemistries   Recent Labs Lab 01/31/16 1120 02/01/16 0514  NA 135 136  K 3.4* 3.7  CL 100* 102  CO2 28 22  GLUCOSE 149* 129*  BUN 12 12  CREATININE 0.61 0.73  CALCIUM 8.9 9.3  MG 1.6*  --   AST 16  --   ALT 9*  --   ALKPHOS 72  --   BILITOT 0.8  --    ------------------------------------------------------------------------------------------------------------------  Cardiac Enzymes  Recent Labs Lab 02/01/16 0514  TROPONINI 0.04*   ------------------------------------------------------------------------------------------------------------------  RADIOLOGY:  Dg Chest 2 View  Result Date: 01/31/2016 CLINICAL DATA:  Fall 4 days ago onto right side. Right anterior chest wall pain. EXAM: CHEST  2 VIEW COMPARISON:  09/28/2015 FINDINGS: There is hyperinflation of the lungs compatible with COPD. Heart is borderline in size. Small right pleural effusion. Right base atelectasis. Left lung is clear. No pneumothorax. No visible rib fracture or acute bony abnormality. IMPRESSION: COPD. Right base atelectasis with small right pleural effusion. No visible rib fracture or pneumothorax. Electronically Signed   By: Charlett NoseKevin  Dover M.D.   On: 01/31/2016 10:57   Ct Head Wo Contrast  Result Date: 01/31/2016 CLINICAL DATA:  Frequent falls EXAM: CT HEAD WITHOUT CONTRAST TECHNIQUE: Contiguous axial images were obtained from the base of the skull through the vertex without intravenous contrast. COMPARISON:  None. FINDINGS: Brain: No evidence of acute infarction, hemorrhage, hydrocephalus, extra-axial collection or mass lesion/mass effect.  Mild atrophic and chronic white matter ischemic changes are noted. Small lacunar infarct is noted in the deep white matter on the left best seen on image number 30 of series 6. Vascular: No hyperdense vessel or unexpected calcification. Skull: No acute fracture is noted. Sinuses/Orbits: No acute finding. IMPRESSION: Atrophic and chronic white matter ischemic changes without acute abnormality Electronically Signed   By: Alcide Clever M.D.   On: 01/31/2016 12:37   Ct Cervical Spine Wo Contrast  Result Date: 01/31/2016 CLINICAL DATA:  Neck pain after a fall. Multiple falls over the past few months. EXAM: CT CERVICAL SPINE WITHOUT CONTRAST TECHNIQUE: Multidetector CT imaging of the cervical spine was performed without intravenous contrast. Multiplanar CT image reconstructions were also generated. COMPARISON:  Head CT of earlier today. Cervical spine plain films of 12/21/2006. FINDINGS: Spinal visualization through the bottom of T1. Prevertebral soft tissues are within normal limits. Left worse than right calcified carotid atherosclerosis. No apical pneumothorax. Biapical pleural-parenchymal scarring. Skull base intact. Maintenance of vertebral body height. Straightening and mild reversal of expected cervical lordosis. Facet arthropathy which is worse on the left at C2-3. Marked degenerative changes about the C1-2 level. most significant on the right. IMPRESSION: 1. Marked spondylosis, without acute fracture subluxation. 2. Straightening and mild reversal of expected cervical lordosis could be positional, due to muscular spasm. Ligamentous injury cannot be excluded. Electronically Signed   By: Jeronimo Greaves M.D.   On: 01/31/2016 14:05   Ct Angio Chest/abd/pel For Dissection W And/or W/wo  Result Date: 01/31/2016 CLINICAL DATA:  Chest pain for 4 days. EXAM: CT CHEST, ABDOMEN, AND PELVIS WITH CONTRAST TECHNIQUE: Multidetector CT imaging of the chest, abdomen and pelvis was performed following the standard protocol  during bolus administration of intravenous contrast. Precontrast imaging through the chest was also obtained. CONTRAST:  100 cc Isovue 370 COMPARISON:  CT abdomen and pelvis dated 12/02/2007 FINDINGS: CT CHEST FINDINGS Cardiovascular: There is no evidence of aortic dissection or intramural hematoma. Maximal diameter of the ascending aorta is 3.4 cm. There is some motion artifact. Maximal diameter of the proximal descending thoracic aorta is 3.6 cm. Mild scattered atherosclerotic calcifications of the aorta are noted. Aberrant right subclavian artery is patent. This is a normal variation. There is some calcified and irregular plaque in the proximal right axillary artery. No obvious significant focal narrowing is present. The right common carotid artery, left common carotid artery, and left subclavian artery are patent. There is atherosclerotic calcified plaque at the origin of both vertebral arteries. They are grossly patent. There are no filling defects in the pulmonary arterial tree to suggest acute pulmonary thromboembolism. Coronary artery calcification in the left main, lad, and circumflex coronary artery distributions is present. Mediastinum/Nodes: The thyroid gland is heterogeneous without obvious nodule. There is no evidence of abnormal mediastinal adenopathy. No pericardial effusion. Lungs/Pleura: Tiny right pleural effusion. No left pleural effusion. No pneumothorax There is dependent atelectasis at the right lung base. Very minimal dependent atelectasis in the left posterior lung. Moderate 2 mm left upper lobe noncalcified nodule on image 26 of series 5. Centrilobular emphysema. Musculoskeletal: No acute rib fracture. Mid thoracic compression deformities have a chronic appearance. No evidence of fracture in the sternum. CT ABDOMEN PELVIS FINDINGS Hepatobiliary: Gallbladder and liver are within normal limits. Pancreas: Atrophic.  No focal mass. Spleen: Calcified granulomata are present. Adrenals/Urinary  Tract: Adrenal glands are somewhat lobulated without obvious focal  mass. There are chronic changes of the kidneys bilaterally. Stomach/Bowel: There is distention of the colon. Moderate stool burden in the ascending colon. No obvious focal mass of the colon is present. No disproportionate dilatation of small bowel. Gas-filled loops of small bowel are upper normal in caliber and likely related to colonic distention. There is no obvious free intraperitoneal gas. Bilateral inguinal hernia containing small bowel loops are no longer visualize compatible with postoperative change from herniorrhaphy. Vascular/Lymphatic: There is no evidence of aortic dissection or aneurysm within the abdomen. Scattered atherosclerotic calcifications are noted. There is irregular plaque at the origin of the celiac axis. There is a web-like area of narrowing 2 cm beyond its origin. Branch vessels are patent. Mild atherosclerotic calcification at the origin of the SMA without significant focal narrowing. There is hypertrophy of pancreatic collateral branches likely providing collateral flow to celiac branches. There is calcified plaque at the origin of the IMA. Beyond its origin, the vessel is patent and diminutive. Two right renal arteries are patent. There is calcific plaque at the origin of the dominant left upper renal artery. There is at least 50% narrowing, 1 cm beyond its origin. The more inferior and more diminutive left renal artery is patent. Right common iliac artery is ectatic with a maximal diameter of 1.4 cm. Diffuse atherosclerotic calcifications are present. No significant focal narrowing in the right common, external, or internal iliac arteries. Left common iliac artery is ectatic with a maximal diameter of 1.8 cm. There are atherosclerotic calcifications throughout the left iliac vasculature. No significant narrowing in the left common, internal, or external iliac arteries. Proximal femoral arteries are grossly patent.  Reproductive: There is noted to be thickening of the wall of the bladder with some calcifications. There is also an abnormality within the very deep pelvis extending into the right ischiorectal fossa. It is difficult to assess Hounsfield unit measurements due to streak artifact but the abnormality measures up to 7.6 cm and is larger than on the prior study. This may represent a bladder diverticulum, however other cystic or masslike abnormality cannot be excluded. Other: There is no free fluid. Musculoskeletal: Advanced multilevel degenerative changes in the lumbar spine are noted. Bilateral L5 pars defects with grade 1 L5-S1 spondylolisthesis. No lumbar vertebral compression deformity is identified. Bilateral total hip arthroplasty. IMPRESSION: There is no evidence of aortic dissection or aneurysm. Coronary artery calcifications are noted. There is significant narrowing in the proximal celiac axis. There is collateral flow role from the SMA via pancreatic branches. There is 50% narrowing of the more dominant superior left renal artery. Right renal arteries are patent. Mild aneurysmal dilatation of the right common iliac artery. 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 Iliac arteries are patent. 2 mm noncalcified left upper lobe pulmonary nodule. No follow-up needed if patient is low-risk. Non-contrast chest CT can be considered in 12 months if patient is high-risk. This recommendation follows the consensus statement: Guidelines for Management of Incidental Pulmonary Nodules Detected on CT Images:From the Fleischner Society 2017; published online before print (10.1148/radiol.[id number]). Tiny right pleural effusion with dependent atelectasis at the right base. Chronic appearing mid to upper thoracic compression deformities. There is diffuse bladder wall thickening. This may be related to bladder outlet obstruction. An inflammatory process is not excluded. There is a space occupying  lesion in the deep right pelvis extending into the right ischiorectal fossa. This may represent a bladder diverticulum. Cystogram may be helpful to further characterize this abnormality. Underlying other cystic  or masslike abnormality are not excluded. There is diffuse distention of small and large bowel without disproportionate dilatation. This may be related to constipation or obstruction secondary to the described cystic abnormality above. Grade 1 L5-S1 spondylolisthesis. Electronically Signed   By: Jolaine Click M.D.   On: 01/31/2016 16:00    EKG:   Orders placed or performed during the Delgado encounter of 01/31/16  . ED EKG  . ED EKG    ASSESSMENT AND PLAN:   Iliya Spivack is a 82 y.o. male presenting with Fall . Admitted 01/31/2016 : Day #: 1 day 1. Recurrent falls: Check vitamin D level, physical therapy evaluation 2. Essential hypertension: Lisinopril  PT recommend SNF, case management following  All the records are reviewed and case discussed with Care Management/Social Workerr. Management plans discussed with the patient, family and they are in agreement.  CODE STATUS: full TOTAL TIME TAKING CARE OF THIS PATIENT: 28 minutes.   POSSIBLE D/C IN 1DAYS, DEPENDING ON CLINICAL CONDITION.   Livingston Denner,  Mardi Mainland.D on 02/02/2016 at 10:53 AM  Between 7am to 6pm - Pager - [phone]  After 6pm: House Pager: - [phone]  [facility]  Office  [phone]  CC: Primary care physician; Corky Downs, MD

## 2016-02-02 NOTE — Clinical Social Work Note (Signed)
Clinical Social Work Assessment  Patient Details  Name: Frank Delgado MRN: [id number] Date of Birth: 01/01/1914  Date of referral:  02/02/16               Reason for consult:  Facility Placement                Permission sought to share information with:  Family Supports Permission granted to share information::  Yes, Release of Information Signed  Name::     Frank Delgado  Agency::     Relationship::  Son  Contact Information:  [phone]  Housing/Transportation Living arrangements for the past 2 months:  Single Family Home Source of Information:  Adult Children Patient Interpreter Needed:  None Criminal Activity/Legal Involvement Pertinent to Current Situation/Hospitalization:  No - Comment as needed Significant Relationships:  Adult Children, Other Family Members Lives with:  Adult Children Do you feel safe going back to the place where you live?  Yes Need for family participation in patient care:  Yes (Comment)  Care giving concerns:  SNF placement vs Home with HHPT   Social Worker assessment / plan:  Patient is alert and oriented x3. Patient deferred to his son, Frank Delgado, for any decisions. CSW contacted [employer] via telephone.  Patient is baseline independent with most ADLs and is continent. Patient uses a walker, but sometimes fails to use which has recently resulted in falls. Patient lives in a single family home with his adult children.  Frank Delgado reported that the family would not like to pursue SNF placement regardless of PT recommendation. Frank Delgado indicates that the family would prefer the patient dc home with Home Health Pt. CSW advised RNCM of this decision.    Employment status:  Retired Database administratornsurance information:  Managed Medicare PT Recommendations:  Skilled Nursing Facility Information / Referral to community resources:     Patient/Family's Response to care:  Patient's family pleasant and determined to have patient dc home with HHPT.  Patient/Family's Understanding of  and Emotional Response to Diagnosis, Current Treatment, and Prognosis:  Patient's son able to verbalize in his own terms the treatment plan and the family's wishes. Patient's son was pleasant and thanked the CSW for support.  Emotional Assessment Appearance:    Attitude/Demeanor/Rapport:   (Patient and family pleasant.) Affect (typically observed):  Appropriate Orientation:  Oriented to Self, Oriented to Place, Oriented to Situation Alcohol / Substance use:  Never Used Psych involvement (Current and /or in the community):  No (Comment)  Discharge Needs  Concerns to be addressed:  Discharge Planning Concerns Readmission within the last 30 days:  No Current discharge risk:    Barriers to Discharge:  Continued Medical Work up   [employer] CorporationKaren M Omkar Stratmann, LCSW 02/02/2016, 11:56 AM

## 2016-02-03 DIAGNOSIS — M545 Low back pain: Secondary | ICD-10-CM | POA: Diagnosis not present

## 2016-02-03 DIAGNOSIS — M549 Dorsalgia, unspecified: Secondary | ICD-10-CM | POA: Diagnosis not present

## 2016-02-03 LAB — GLUCOSE, CAPILLARY
Glucose-Capillary: 108 mg/dL — ABNORMAL HIGH (ref 65–99)
Glucose-Capillary: 179 mg/dL — ABNORMAL HIGH (ref 65–99)

## 2016-02-03 LAB — VITAMIN D 25 HYDROXY (VIT D DEFICIENCY, FRACTURES): Vit D, 25-Hydroxy: 18.2 ng/mL — ABNORMAL LOW (ref 30.0–100.0)

## 2016-02-03 NOTE — Care Management Obs Status (Signed)
MEDICARE OBSERVATION STATUS NOTIFICATION   Patient Details  Name: Frank Delgado MRN: [id number] Date of Birth: 12/25/1913   Medicare Observation Status Notification Given:  Yes    Marily MemosLisa M Korion Cuevas, RN 02/03/2016, 9:46 AM

## 2016-02-03 NOTE — Progress Notes (Signed)
Discharge instructions given to patient and son. Verbalized understanding. IVs and tele removed. No distress at this time. Son is at bedside and will be transporting patient home.

## 2016-02-03 NOTE — Discharge Summary (Addendum)
[facility]   PATIENT NAME: Frank Delgado    MR#:  [id number]  DATE OF BIRTH:  [birth year]  DATE OF ADMISSION:  01/31/2016 ADMITTING PHYSICIAN: Katharina Caper, MD  DATE OF DISCHARGE: 02/03/16  PRIMARY CARE PHYSICIAN: MASOUD,JAVED, MD    ADMISSION DIAGNOSIS:  Back pain [M54.9] Abnormal EKG [R94.31] Essential hypertension [I10] Fall at home, initial encounter [W19.Lorne Skeens, Y92.099]  DISCHARGE DIAGNOSIS:  Principal Problem:   Back pain Active Problems:   Generalized weakness   Malignant essential hypertension   Abnormal EKG   SECONDARY DIAGNOSIS:   Past Medical History:  Diagnosis Date  . CHF (congestive heart failure) (HCC)   . Diabetes mellitus without complication (HCC)   . Hypertension     HOSPITAL COURSE:  Frank Delgado  is a 82 y.o. male admitted 01/31/2016 with chief complaint Fall . Please see H&P performed by Katharina Caper, MD for further information. Patient presented after mechanical fall. Found to have abnormal ekg. Placed on telemetry enzymes trended. PT eval recommended SNF, family want home instead  DISCHARGE CONDITIONS:   stable  CONSULTS OBTAINED:  Treatment Team:  Dalia Heading, MD  DRUG ALLERGIES:  No Known Allergies  DISCHARGE MEDICATIONS:   Current Discharge Medication List    CONTINUE these medications which have NOT CHANGED   Details  aspirin EC 81 MG EC tablet Take 1 tablet (81 mg total) by mouth daily. Qty: 30 tablet, Refills: 0    docusate sodium (COLACE) 100 MG capsule Take 1 capsule (100 mg total) by mouth 2 (two) times daily. Qty: 10 capsule, Refills: 0    folic acid (FOLVITE) 1 MG tablet Take 1 tablet (1 mg total) by mouth daily. Qty: 30 tablet, Refills: 0    furosemide (LASIX) 40 MG tablet Take 40 mg by mouth daily.     glimepiride (AMARYL) 2 MG tablet Take 2 mg by mouth daily with breakfast.     lisinopril (PRINIVIL,ZESTRIL) 10 MG tablet Take 10 mg by mouth daily.     pravastatin  (PRAVACHOL) 40 MG tablet Take 40 mg by mouth at bedtime.     traMADol (ULTRAM) 50 MG tablet Take 1-2 tablets by mouth every 6 hours as needed for moderate to severe pain Qty: 20 tablet, Refills: 0    potassium chloride (K-DUR) 10 MEQ tablet Take 10 mEq by mouth daily.       STOP taking these medications     colchicine 0.6 MG tablet      Multiple Vitamin (MULTIVITAMIN WITH MINERALS) TABS tablet      predniSONE (DELTASONE) 10 MG tablet          DISCHARGE INSTRUCTIONS:    DIET:  Regular diet  DISCHARGE CONDITION:  Good  ACTIVITY:  Activity as tolerated  OXYGEN:  Home Oxygen: No.   Oxygen Delivery: room air  DISCHARGE LOCATION:  home   If you experience worsening of your admission symptoms, develop shortness of breath, life threatening emergency, suicidal or homicidal thoughts you must seek medical attention immediately by calling 911 or calling your MD immediately  if symptoms less severe.  You Must read complete instructions/literature along with all the possible adverse reactions/side effects for all the Medicines you take and that have been prescribed to you. Take any new Medicines after you have completely understood and accpet all the possible adverse reactions/side effects.   Please note  You were cared for by a hospitalist during your hospital stay. If you have any questions about your discharge  medications or the care you received while you were in the hospital after you are discharged, you can call the unit and asked to speak with the hospitalist on call if the hospitalist that took care of you is not available. Once you are discharged, your primary care physician will handle any further medical issues. Please note that NO REFILLS for any discharge medications will be authorized once you are discharged, as it is imperative that you return to your primary care physician (or establish a relationship with a primary care physician if you do not have one) for your  aftercare needs so that they can reassess your need for medications and monitor your lab values.    On the day of Discharge:   VITAL SIGNS:  Blood pressure (!) 162/77, pulse 69, temperature 97.8 F (36.6 C), temperature source Oral, resp. rate 16, height 5\' 8"  (1.727 m), weight 61.2 kg (134 lb 14.4 oz), SpO2 98 %.  I/O:   Intake/Output Summary (Last 24 hours) at 02/03/16 0943 Last data filed at 02/03/16 0754  Gross per 24 hour  Intake              720 ml  Output              750 ml  Net              -30 ml    PHYSICAL EXAMINATION:  GENERAL:  82 y.o.-year-old patient lying in the bed with no acute distress.  EYES: Pupils equal, round, reactive to light and accommodation. No scleral icterus. Extraocular muscles intact.  HEENT: Head atraumatic, normocephalic. Oropharynx and nasopharynx clear.  NECK:  Supple, no jugular venous distention. No thyroid enlargement, no tenderness.  LUNGS: Normal breath sounds bilaterally, no wheezing, rales,rhonchi or crepitation. No use of accessory muscles of respiration.  CARDIOVASCULAR: S1, S2 normal. No murmurs, rubs, or gallops.  ABDOMEN: Soft, non-tender, non-distended. Bowel sounds present. No organomegaly or mass.  EXTREMITIES: No pedal edema, cyanosis, or clubbing.  NEUROLOGIC: Cranial nerves II through XII are intact. Muscle strength 5/5 in all extremities. Sensation intact. Gait not checked.  PSYCHIATRIC: The patient is alert and oriented x 3. Speech garbled at baseline SKIN: No obvious rash, lesion, or ulcer.   DATA REVIEW:   CBC  Recent Labs Lab 02/01/16 0514  WBC 6.4  HGB 12.2*  HCT 37.2*  PLT 199    Chemistries   Recent Labs Lab 01/31/16 1120 02/01/16 0514  NA 135 136  K 3.4* 3.7  CL 100* 102  CO2 28 22  GLUCOSE 149* 129*  BUN 12 12  CREATININE 0.61 0.73  CALCIUM 8.9 9.3  MG 1.6*  --   AST 16  --   ALT 9*  --   ALKPHOS 72  --   BILITOT 0.8  --     Cardiac Enzymes  Recent Labs Lab 02/01/16 0514    TROPONINI 0.04*    Microbiology Results  Results for orders placed or performed in visit on 03/28/12  Urine culture     Status: None   Collection Time: 03/28/12  1:59 PM  Result Value Ref Range Status   Micro Text Report   Final       SOURCE: CLEAN CATCH URINE    ORGANISM 1                >100,000 CFU/ML ENTEROBACTER AEROGENES   ANTIBIOTIC  ORG#1     CEFAZOLIN                     R         CEFTRIAXONE                   S         CIPROFLOXACIN                 S         GENTAMICIN                    S         IMIPENEM                      S         LEVOFLOXACIN                  S         NITROFURANTOIN                S         CEFOXITIN                     R         TRIMETHOPRIM/SULFAMETHOXAZOLE S             RADIOLOGY:  No results found.   Management plans discussed with the patient, family and they are in agreement.  CODE STATUS:     Code Status Orders        Start     Ordered   01/31/16 1525  Full code  Continuous     01/31/16 1524    Code Status History    Date Active Date Inactive Code Status Order ID Comments User Context   06/17/2015  4:06 PM 06/20/2015  6:46 PM Full Code [id number]  Milagros LollSrikar Sudini, MD ED    Advance Directive Documentation   Flowsheet Row Most Recent Value  Type of Advance Directive  Living will, Healthcare Power of Attorney  Pre-existing out of facility DNR order (yellow form or pink MOST form)  No data  "MOST" Form in Place?  No data      TOTAL TIME TAKING CARE OF THIS PATIENT: 33 minutes.    Hower,  Mardi MainlandDavid K M.D on 02/03/2016 at 9:43 AM  Between 7am to 6pm - Pager - [phone]  After 6pm go to [email or web address] - Social research officer, governmentpassword [email or web address]  [employer] [place] [facility]  Office  [phone]  CC: Primary care physician; Corky DownsMASOUD,JAVED, MD

## 2016-02-03 NOTE — Care Management Note (Signed)
Case Management Note  Patient Details  Name: Frank Delgado MRN: [id number] Date of Birth: 03/21/1914  Subjective/Objective:   Spoke with grandson, Frank Delgado [phone]). Patient lives at home with him and has around the clock care by family.  He uses a walker. PCP is Dr. Juel BurrowMasoud. Discussed home health with grandson. He is agreeable with no agency preference. Would benefit from SN and PT.              Action/Plan: [facility].   Expected Discharge Date:   02/03/2016               Expected Discharge Plan:  Home w Home Health Services  In-House Referral:     Discharge planning Services  CM Consult  Post Acute Care Choice:  [facility]  DME Arranged:    DME Agency:     HH Arranged:  RN, PT HH Agency:  [facility]  Status of Service:  Completed, signed off  If discussed at Long Length of Stay Meetings, dates discussed:    Additional Comments:  Marily MemosLisa M Evangelyn Crouse, RN 02/03/2016, 9:49 AM

## 2016-02-03 NOTE — Clinical Social Work Note (Signed)
MSW received referral for SNF.  Case discussed with case manager and plan is to discharge home with home health.  MSW to sign off please re-consult if social work needs arise.  Frank Delgado, MSW Mon-Fri 8a-4:30p [phone]   

## 2016-04-13 ENCOUNTER — Emergency Department: Payer: Medicare PPO

## 2016-04-13 ENCOUNTER — Encounter: Payer: Self-pay | Admitting: Emergency Medicine

## 2016-04-13 ENCOUNTER — Emergency Department
Admission: EM | Admit: 2016-04-13 | Discharge: 2016-04-13 | Disposition: A | Discharge status: Home / Self Care | Payer: Medicare PPO | Attending: Emergency Medicine | Admitting: Emergency Medicine

## 2016-04-13 DIAGNOSIS — Z79899 Other long term (current) drug therapy: Secondary | ICD-10-CM | POA: Insufficient documentation

## 2016-04-13 DIAGNOSIS — N390 Urinary tract infection, site not specified: Secondary | ICD-10-CM | POA: Insufficient documentation

## 2016-04-13 DIAGNOSIS — I509 Heart failure, unspecified: Secondary | ICD-10-CM | POA: Diagnosis not present

## 2016-04-13 DIAGNOSIS — E119 Type 2 diabetes mellitus without complications: Secondary | ICD-10-CM | POA: Insufficient documentation

## 2016-04-13 DIAGNOSIS — R410 Disorientation, unspecified: Secondary | ICD-10-CM

## 2016-04-13 DIAGNOSIS — I110 Hypertensive heart disease with heart failure: Secondary | ICD-10-CM | POA: Diagnosis not present

## 2016-04-13 DIAGNOSIS — Z7982 Long term (current) use of aspirin: Secondary | ICD-10-CM | POA: Insufficient documentation

## 2016-04-13 DIAGNOSIS — R4182 Altered mental status, unspecified: Secondary | ICD-10-CM | POA: Diagnosis present

## 2016-04-13 LAB — DIFFERENTIAL
BASOS ABS: 0 10*3/uL (ref 0–0.1)
BASOS PCT: 1 %
Eosinophils Absolute: 0 10*3/uL (ref 0–0.7)
Eosinophils Relative: 1 %
LYMPHS PCT: 21 %
Lymphs Abs: 1.1 10*3/uL (ref 1.0–3.6)
Monocytes Absolute: 0.9 10*3/uL (ref 0.2–1.0)
Monocytes Relative: 17 %
NEUTROS ABS: 3.4 10*3/uL (ref 1.4–6.5)
NEUTROS PCT: 60 %

## 2016-04-13 LAB — CBC
HCT: 37.7 % — ABNORMAL LOW (ref 40.0–52.0)
Hemoglobin: 12.1 g/dL — ABNORMAL LOW (ref 13.0–18.0)
MCH: 26.6 pg (ref 26.0–34.0)
MCHC: 32.1 g/dL (ref 32.0–36.0)
MCV: 82.7 fL (ref 80.0–100.0)
PLATELETS: 250 10*3/uL (ref 150–440)
RBC: 4.56 MIL/uL (ref 4.40–5.90)
RDW: 16.4 % — AB (ref 11.5–14.5)
WBC: 5.5 10*3/uL (ref 3.8–10.6)

## 2016-04-13 LAB — ETHANOL

## 2016-04-13 LAB — URINALYSIS COMPLETE WITH MICROSCOPIC (ARMC ONLY)
Bacteria, UA: NONE SEEN
Bilirubin Urine: NEGATIVE
Glucose, UA: 150 mg/dL — AB
Ketones, ur: NEGATIVE mg/dL
Nitrite: NEGATIVE
Protein, ur: NEGATIVE mg/dL
Specific Gravity, Urine: 1.018 (ref 1.005–1.030)
pH: 5 (ref 5.0–8.0)

## 2016-04-13 LAB — COMPREHENSIVE METABOLIC PANEL
ALBUMIN: 4.1 g/dL (ref 3.5–5.0)
ALK PHOS: 80 U/L (ref 38–126)
ALT: 9 U/L — AB (ref 17–63)
AST: 21 U/L (ref 15–41)
Anion gap: 8 (ref 5–15)
BUN: 20 mg/dL (ref 6–20)
CALCIUM: 9.8 mg/dL (ref 8.9–10.3)
CHLORIDE: 102 mmol/L (ref 101–111)
CO2: 26 mmol/L (ref 22–32)
CREATININE: 1 mg/dL (ref 0.61–1.24)
GFR calc non Af Amer: 59 mL/min — ABNORMAL LOW (ref >60)
GLUCOSE: 204 mg/dL — AB (ref 65–99)
Potassium: 4.5 mmol/L (ref 3.5–5.1)
SODIUM: 136 mmol/L (ref 135–145)
Total Bilirubin: 0.3 mg/dL (ref 0.3–1.2)
Total Protein: 8 g/dL (ref 6.5–8.1)

## 2016-04-13 LAB — PROTIME-INR
INR: 1.11
PROTHROMBIN TIME: 14.3 s (ref 11.4–15.2)

## 2016-04-13 LAB — URINE DRUG SCREEN, QUALITATIVE (ARMC ONLY)
Amphetamines, Ur Screen: NOT DETECTED
Barbiturates, Ur Screen: NOT DETECTED
Benzodiazepine, Ur Scrn: NOT DETECTED
Cannabinoid 50 Ng, Ur ~~LOC~~: NOT DETECTED
Cocaine Metabolite,Ur ~~LOC~~: NOT DETECTED
MDMA (Ecstasy)Ur Screen: NOT DETECTED
Methadone Scn, Ur: NOT DETECTED
Opiate, Ur Screen: NOT DETECTED
Phencyclidine (PCP) Ur S: NOT DETECTED
Tricyclic, Ur Screen: NOT DETECTED

## 2016-04-13 LAB — APTT: APTT: 25 s (ref 24–36)

## 2016-04-13 MED ORDER — CEPHALEXIN 500 MG PO CAPS
500.0000 mg | ORAL_CAPSULE | Freq: Once | ORAL | Status: AC
  Administered 2016-04-13: 500 mg via ORAL
  Filled 2016-04-13: qty 1

## 2016-04-13 MED ORDER — CEPHALEXIN 500 MG PO CAPS: 500.0000 mg | ORAL_CAPSULE | Freq: Three times a day (TID) | ORAL | 0 refills | Status: AC

## 2016-04-13 NOTE — Discharge Instructions (Signed)

## 2016-04-13 NOTE — ED Provider Notes (Signed)
[facility] Emergency Department Provider Note  ____________________________________________  Time seen: Approximately 7:27 PM  I have reviewed the triage vital signs and the nursing notes.   HISTORY  Chief Complaint Altered Mental Status  Level 5 caveat:  Portions of the history and physical were unable to be obtained due to AMS   HPI Glean SalenCurtis Horrell is a [age] y.o. male history of diabetes, hypertension, CHF who presents from home for confusion and slurred speech. History is gathered mostly from patient's son.  Patient has been more confused with slurred speech, drooling more than his normal self. Did not recognize family members. Unknown last normal. No prior history of strokes. Patient lives at home with her nephew. Patient is full code. Per EMS patient was walking with his walker at home but was confused. Patient usually follows commands and is able to answer questions at baseline. Per family patient is now speaking normally but still does not recognize family members at the bedside. Patient denies headache, abdominal pain, chest pain, shortness of breath, pain in his extremities, back pain.  Past Medical History:  Diagnosis Date  . CHF (congestive heart failure) (HCC)   . Diabetes mellitus without complication (HCC)   . Hypertension     Patient Active Problem List   Diagnosis Date Noted  . Back pain 01/31/2016  . Generalized weakness 01/31/2016  . Malignant essential hypertension 01/31/2016  . Abnormal EKG 01/31/2016  . Anasarca 06/17/2015    Past Surgical History:  Procedure Laterality Date  . HIP SURGERY    . STOMACH SURGERY      Prior to Admission medications   Medication Sig Start Date End Date Taking? Authorizing Provider  aspirin EC 81 MG EC tablet Take 1 tablet (81 mg total) by mouth daily. 06/20/15   Enedina FinnerSona Patel, MD  cephALEXin (KEFLEX) 500 MG capsule Take 1 capsule (500 mg total) by mouth 3 (three) times daily. 04/13/16 04/20/16  Nita Sicklearolina  Yacine Droz, MD  docusate sodium (COLACE) 100 MG capsule Take 1 capsule (100 mg total) by mouth 2 (two) times daily. 06/20/15   Enedina FinnerSona Patel, MD  folic acid (FOLVITE) 1 MG tablet Take 1 tablet (1 mg total) by mouth daily. 06/20/15   Enedina FinnerSona Patel, MD  furosemide (LASIX) 40 MG tablet Take 40 mg by mouth daily.     Historical Provider, MD  glimepiride (AMARYL) 2 MG tablet Take 2 mg by mouth daily with breakfast.     Historical Provider, MD  lisinopril (PRINIVIL,ZESTRIL) 10 MG tablet Take 10 mg by mouth daily.     Historical Provider, MD  potassium chloride (K-DUR) 10 MEQ tablet Take 10 mEq by mouth daily.     Historical Provider, MD  pravastatin (PRAVACHOL) 40 MG tablet Take 40 mg by mouth at bedtime.     Historical Provider, MD  traMADol (ULTRAM) 50 MG tablet Take 1-2 tablets by mouth every 6 hours as needed for moderate to severe pain 09/28/15   Loleta Roseory Forbach, MD    Allergies Patient has no known allergies.  History reviewed. No pertinent family history.  Social History Social History  Substance Use Topics  . Smoking status: Never Smoker  . Smokeless tobacco: Never Used  . Alcohol use No    Review of Systems  Constitutional: Negative for fever. Eyes: Negative for visual changes. ENT: Negative for sore throat. Cardiovascular: Negative for chest pain. Respiratory: Negative for shortness of breath. Gastrointestinal: Negative for abdominal pain, vomiting or diarrhea. Genitourinary: Negative for dysuria. Musculoskeletal: Negative for back pain.  Skin: Negative for rash. Neurological: Negative for headaches, weakness or numbness. + slurred speech and confusion  ____________________________________________   PHYSICAL EXAM:  VITAL SIGNS: ED Triage Vitals [04/13/16 1905]  Enc Vitals Group     BP (!) 159/93     Pulse Rate 85     Resp 16     Temp 98.1 F (36.7 C)     Temp Source Oral     SpO2 98 %     Weight      Height      Head Circumference      Peak Flow      Pain Score      Pain  Loc      Pain Edu?      Excl. in GC?     Constitutional: Alert and oriented x1 baseline per family. Well appearing and in no apparent distress. HEENT:      Head: Normocephalic and atraumatic.         Eyes: Conjunctivae are normal. Sclera is non-icteric. EOMI. PERRL      Mouth/Throat: Mucous membranes are moist.       Neck: Supple with no signs of meningismus. Cardiovascular: Regular rate and rhythm. No murmurs, gallops, or rubs. 2+ symmetrical distal pulses are present in all extremities. No JVD. Respiratory: Normal respiratory effort. Lungs are clear to auscultation bilaterally. No wheezes, crackles, or rhonchi.  Gastrointestinal: Soft, non tender, and non distended with positive bowel sounds. No rebound or guarding. Musculoskeletal: Nontender with normal range of motion in all extremities. No edema, cyanosis, or erythema of extremities. Neurologic: Normal speech and language. Normal shrinks and sensation throughout, no pronator drift, Face is symmetric. Moving all extremities. No gross focal neurologic deficits are appreciated. Skin: Skin is warm, dry and intact. No rash noted.  ____________________________________________   LABS (all labs ordered are listed, but only abnormal results are displayed)  Labs Reviewed  CBC - Abnormal; Notable for the following:       Result Value   Hemoglobin 12.1 (*)    HCT 37.7 (*)    RDW 16.4 (*)    All other components within normal limits  COMPREHENSIVE METABOLIC PANEL - Abnormal; Notable for the following:    Glucose, Bld 204 (*)    ALT 9 (*)    GFR calc non Af Amer 59 (*)    All other components within normal limits  URINALYSIS COMPLETEWITH MICROSCOPIC ([facility] ONLY) - Abnormal; Notable for the following:    Color, Urine YELLOW (*)    APPearance CLEAR (*)    Glucose, UA 150 (*)    Hgb urine dipstick 1+ (*)    Leukocytes, UA 1+ (*)    Squamous Epithelial / LPF 0-5 (*)    All other components within normal limits  ETHANOL  PROTIME-INR    APTT  DIFFERENTIAL  URINE DRUG SCREEN, QUALITATIVE ([facility] ONLY)  I-STAT TROPOININ, ED   ____________________________________________  EKG  ED ECG REPORT I, Nita Sicklearolina Jakavion Bilodeau, the attending physician, personally viewed and interpreted this ECG.  Normal sinus rhythm, rate of 79, normal intervals, normal axis, no ST elevations or depressions. ____________________________________________  RADIOLOGY  CXR: Negative  CT head: No acute abnormality.  Atrophy and chronic microvascular ischemic change. ____________________________________________   PROCEDURES  Procedure(s) performed: None Procedures Critical Care performed:  None ____________________________________________   INITIAL IMPRESSION / ASSESSMENT AND PLAN / ED COURSE  [age] y.o. male history of diabetes, hypertension, CHF who presents from home for confusion and slurred speech. Unknown last normal period according  to the family patient speech is now back to normal however he is too confused and unable to recognize his family members. Patient is neurologically intact otherwise. Physical exam with no acute findings. Head CT with no evidence of stroke. Chest x-ray with no evidence of pneumonia. Blood work, UA pending.  Clinical Course as of Apr 14 2211  Mon Apr 13, 2016  2147 CT head, chest x-ray, blood work with no acute findings. Patient does have a urinary tract infection but no fever or leukocytosis. Will treat with keflex and dc home with family.  [CV]    Clinical Course User Index [CV] Nita Sickle, MD   Patient has f/u appointment with PCP on Wednesday morning.    Pertinent labs & imaging results that were available during my care of the patient were reviewed by me and considered in my medical decision making (see chart for details).    ____________________________________________   FINAL CLINICAL IMPRESSION(S) / ED DIAGNOSES  Final diagnoses:  Confusion  Urinary tract infection without hematuria,  site unspecified      NEW MEDICATIONS STARTED DURING THIS VISIT:  New Prescriptions   CEPHALEXIN (KEFLEX) 500 MG CAPSULE    Take 1 capsule (500 mg total) by mouth 3 (three) times daily.     Note:  This document was prepared using Dragon voice recognition software and may include unintentional dictation errors.    Nita Sickle, MD 04/13/16 2213

## 2016-04-13 NOTE — ED Triage Notes (Signed)
Pt arrived to room by EMS from home where he lives with grandson. EMS reports call was for possible stroke like symptoms. Report given by family was that pt "is just not with it today." Pt has HX of diabetes and hypertension, non-compliant with home meds. EMS reports pt was ambulatory to stretcher on scene with the assistance of a walker. Pt is hard of hearing.

## 2016-05-04 ENCOUNTER — Ambulatory Visit: Payer: Medicare PPO | Admitting: Podiatry

## 2016-05-14 ENCOUNTER — Encounter: Payer: Self-pay | Admitting: Podiatry

## 2016-05-14 ENCOUNTER — Ambulatory Visit (INDEPENDENT_AMBULATORY_CARE_PROVIDER_SITE_OTHER): Payer: Medicare PPO | Admitting: Podiatry

## 2016-05-14 DIAGNOSIS — M79609 Pain in unspecified limb: Secondary | ICD-10-CM | POA: Diagnosis not present

## 2016-05-14 DIAGNOSIS — B351 Tinea unguium: Secondary | ICD-10-CM | POA: Diagnosis not present

## 2016-05-14 DIAGNOSIS — E0843 Diabetes mellitus due to underlying condition with diabetic autonomic (poly)neuropathy: Secondary | ICD-10-CM

## 2016-05-14 DIAGNOSIS — L603 Nail dystrophy: Secondary | ICD-10-CM

## 2016-05-14 DIAGNOSIS — L608 Other nail disorders: Secondary | ICD-10-CM

## 2016-05-17 NOTE — Progress Notes (Signed)
SUBJECTIVE Patient with a history of diabetes mellitus presents to office today complaining of elongated, thickened nails. Pain while ambulating in shoes. Patient is unable to trim their own nails.   No Known Allergies  OBJECTIVE General Patient is awake, alert, and oriented x 3 and in no acute distress. Derm Skin is dry and supple bilateral. Negative open lesions or macerations. Remaining integument unremarkable. Nails are tender, long, thickened and dystrophic with subungual debris, consistent with onychomycosis, 1-5 bilateral. No signs of infection noted. Vasc  DP and PT pedal pulses palpable bilaterally. Temperature gradient within normal limits.  Neuro Epicritic and protective threshold sensation diminished bilaterally.  Musculoskeletal Exam No symptomatic pedal deformities noted bilateral. Muscular strength within normal limits.  ASSESSMENT 1. Diabetes Mellitus w/ peripheral neuropathy 2. Onychomycosis of nail due to dermatophyte bilateral 3. Pain in foot bilateral  PLAN OF CARE 1. Patient evaluated today. 2. Instructed to maintain good pedal hygiene and foot care. Stressed importance of controlling blood sugar.  3. Mechanical debridement of nails 1-5 bilaterally performed using a nail nipper. Filed with dremel without incident.  4. Return to clinic in 3 mos.     Dayane Hillenburg M. Deronda Christian, DPM [facility]  Dr. Cindi Ghazarian M. Ziyan Hillmer, DPM   [address]                                        [place], [place] [address]                Office [phone]  Fax [phone]    

## 2017-11-15 DIAGNOSIS — R269 Unspecified abnormalities of gait and mobility: Secondary | ICD-10-CM | POA: Diagnosis not present

## 2017-11-15 DIAGNOSIS — F0390 Unspecified dementia without behavioral disturbance: Secondary | ICD-10-CM | POA: Diagnosis not present

## 2017-11-15 DIAGNOSIS — M1990 Unspecified osteoarthritis, unspecified site: Secondary | ICD-10-CM | POA: Diagnosis not present

## 2017-11-15 DIAGNOSIS — K2970 Gastritis, unspecified, without bleeding: Secondary | ICD-10-CM | POA: Diagnosis not present

## 2017-12-13 DIAGNOSIS — R269 Unspecified abnormalities of gait and mobility: Secondary | ICD-10-CM | POA: Diagnosis not present

## 2017-12-13 DIAGNOSIS — F0390 Unspecified dementia without behavioral disturbance: Secondary | ICD-10-CM | POA: Diagnosis not present

## 2017-12-13 DIAGNOSIS — K2970 Gastritis, unspecified, without bleeding: Secondary | ICD-10-CM | POA: Diagnosis not present

## 2017-12-13 DIAGNOSIS — M1990 Unspecified osteoarthritis, unspecified site: Secondary | ICD-10-CM | POA: Diagnosis not present

## 2018-01-17 DIAGNOSIS — R269 Unspecified abnormalities of gait and mobility: Secondary | ICD-10-CM | POA: Diagnosis not present

## 2018-01-17 DIAGNOSIS — H6120 Impacted cerumen, unspecified ear: Secondary | ICD-10-CM | POA: Diagnosis not present

## 2018-01-17 DIAGNOSIS — K2970 Gastritis, unspecified, without bleeding: Secondary | ICD-10-CM | POA: Diagnosis not present

## 2018-01-17 DIAGNOSIS — M1990 Unspecified osteoarthritis, unspecified site: Secondary | ICD-10-CM | POA: Diagnosis not present

## 2018-02-01 DIAGNOSIS — R269 Unspecified abnormalities of gait and mobility: Secondary | ICD-10-CM | POA: Diagnosis not present

## 2018-02-01 DIAGNOSIS — H6120 Impacted cerumen, unspecified ear: Secondary | ICD-10-CM | POA: Diagnosis not present

## 2018-02-01 DIAGNOSIS — M1990 Unspecified osteoarthritis, unspecified site: Secondary | ICD-10-CM | POA: Diagnosis not present

## 2018-02-01 DIAGNOSIS — K2970 Gastritis, unspecified, without bleeding: Secondary | ICD-10-CM | POA: Diagnosis not present

## 2018-02-15 DIAGNOSIS — H6123 Impacted cerumen, bilateral: Secondary | ICD-10-CM | POA: Diagnosis not present

## 2018-02-15 DIAGNOSIS — H6500 Acute serous otitis media, unspecified ear: Secondary | ICD-10-CM | POA: Diagnosis not present

## 2018-02-15 DIAGNOSIS — H6980 Other specified disorders of Eustachian tube, unspecified ear: Secondary | ICD-10-CM | POA: Diagnosis not present

## 2018-04-04 DIAGNOSIS — K2970 Gastritis, unspecified, without bleeding: Secondary | ICD-10-CM | POA: Diagnosis not present

## 2018-04-04 DIAGNOSIS — R269 Unspecified abnormalities of gait and mobility: Secondary | ICD-10-CM | POA: Diagnosis not present

## 2018-04-04 DIAGNOSIS — F0390 Unspecified dementia without behavioral disturbance: Secondary | ICD-10-CM | POA: Diagnosis not present

## 2018-04-04 DIAGNOSIS — M1990 Unspecified osteoarthritis, unspecified site: Secondary | ICD-10-CM | POA: Diagnosis not present

## 2018-04-04 DIAGNOSIS — Z0000 Encounter for general adult medical examination without abnormal findings: Secondary | ICD-10-CM | POA: Diagnosis not present

## 2018-04-05 DIAGNOSIS — F0390 Unspecified dementia without behavioral disturbance: Secondary | ICD-10-CM | POA: Diagnosis not present

## 2018-04-05 DIAGNOSIS — R269 Unspecified abnormalities of gait and mobility: Secondary | ICD-10-CM | POA: Diagnosis not present

## 2018-04-05 DIAGNOSIS — K2970 Gastritis, unspecified, without bleeding: Secondary | ICD-10-CM | POA: Diagnosis not present

## 2018-04-05 DIAGNOSIS — M1990 Unspecified osteoarthritis, unspecified site: Secondary | ICD-10-CM | POA: Diagnosis not present

## 2018-04-18 DIAGNOSIS — M1990 Unspecified osteoarthritis, unspecified site: Secondary | ICD-10-CM | POA: Diagnosis not present

## 2018-04-18 DIAGNOSIS — F0390 Unspecified dementia without behavioral disturbance: Secondary | ICD-10-CM | POA: Diagnosis not present

## 2018-04-18 DIAGNOSIS — Z0000 Encounter for general adult medical examination without abnormal findings: Secondary | ICD-10-CM | POA: Diagnosis not present

## 2018-04-18 DIAGNOSIS — R269 Unspecified abnormalities of gait and mobility: Secondary | ICD-10-CM | POA: Diagnosis not present

## 2018-04-18 DIAGNOSIS — K2970 Gastritis, unspecified, without bleeding: Secondary | ICD-10-CM | POA: Diagnosis not present

## 2018-04-19 DIAGNOSIS — I10 Essential (primary) hypertension: Secondary | ICD-10-CM | POA: Diagnosis not present

## 2018-04-19 DIAGNOSIS — R5381 Other malaise: Secondary | ICD-10-CM | POA: Diagnosis not present

## 2018-04-26 DIAGNOSIS — M1990 Unspecified osteoarthritis, unspecified site: Secondary | ICD-10-CM | POA: Diagnosis not present

## 2018-04-26 DIAGNOSIS — R269 Unspecified abnormalities of gait and mobility: Secondary | ICD-10-CM | POA: Diagnosis not present

## 2018-04-26 DIAGNOSIS — F0390 Unspecified dementia without behavioral disturbance: Secondary | ICD-10-CM | POA: Diagnosis not present

## 2018-04-26 DIAGNOSIS — K2970 Gastritis, unspecified, without bleeding: Secondary | ICD-10-CM | POA: Diagnosis not present

## 2018-05-10 DIAGNOSIS — D509 Iron deficiency anemia, unspecified: Secondary | ICD-10-CM | POA: Diagnosis not present

## 2018-05-10 DIAGNOSIS — K2970 Gastritis, unspecified, without bleeding: Secondary | ICD-10-CM | POA: Diagnosis not present

## 2018-05-10 DIAGNOSIS — M1990 Unspecified osteoarthritis, unspecified site: Secondary | ICD-10-CM | POA: Diagnosis not present

## 2018-05-10 DIAGNOSIS — F0390 Unspecified dementia without behavioral disturbance: Secondary | ICD-10-CM | POA: Diagnosis not present

## 2018-06-21 DIAGNOSIS — R269 Unspecified abnormalities of gait and mobility: Secondary | ICD-10-CM | POA: Diagnosis not present

## 2018-06-21 DIAGNOSIS — F0390 Unspecified dementia without behavioral disturbance: Secondary | ICD-10-CM | POA: Diagnosis not present

## 2018-06-21 DIAGNOSIS — K2970 Gastritis, unspecified, without bleeding: Secondary | ICD-10-CM | POA: Diagnosis not present

## 2018-06-21 DIAGNOSIS — D509 Iron deficiency anemia, unspecified: Secondary | ICD-10-CM | POA: Diagnosis not present

## 2018-09-15 DIAGNOSIS — K2970 Gastritis, unspecified, without bleeding: Secondary | ICD-10-CM | POA: Diagnosis not present

## 2018-09-15 DIAGNOSIS — F0390 Unspecified dementia without behavioral disturbance: Secondary | ICD-10-CM | POA: Diagnosis not present

## 2018-09-15 DIAGNOSIS — D509 Iron deficiency anemia, unspecified: Secondary | ICD-10-CM | POA: Diagnosis not present

## 2018-09-15 DIAGNOSIS — D649 Anemia, unspecified: Secondary | ICD-10-CM | POA: Diagnosis not present

## 2019-02-21 DIAGNOSIS — D509 Iron deficiency anemia, unspecified: Secondary | ICD-10-CM | POA: Diagnosis not present

## 2019-02-21 DIAGNOSIS — E119 Type 2 diabetes mellitus without complications: Secondary | ICD-10-CM | POA: Diagnosis not present

## 2019-02-21 DIAGNOSIS — D649 Anemia, unspecified: Secondary | ICD-10-CM | POA: Diagnosis not present

## 2019-02-21 DIAGNOSIS — K2970 Gastritis, unspecified, without bleeding: Secondary | ICD-10-CM | POA: Diagnosis not present

## 2019-03-02 DIAGNOSIS — R269 Unspecified abnormalities of gait and mobility: Secondary | ICD-10-CM | POA: Diagnosis not present

## 2019-03-02 DIAGNOSIS — D509 Iron deficiency anemia, unspecified: Secondary | ICD-10-CM | POA: Diagnosis not present

## 2019-03-02 DIAGNOSIS — Z0000 Encounter for general adult medical examination without abnormal findings: Secondary | ICD-10-CM | POA: Diagnosis not present

## 2019-03-02 DIAGNOSIS — K2970 Gastritis, unspecified, without bleeding: Secondary | ICD-10-CM | POA: Diagnosis not present

## 2019-03-02 DIAGNOSIS — D649 Anemia, unspecified: Secondary | ICD-10-CM | POA: Diagnosis not present

## 2019-05-28 DIAGNOSIS — N119 Chronic tubulo-interstitial nephritis, unspecified: Secondary | ICD-10-CM | POA: Diagnosis not present

## 2019-06-13 DIAGNOSIS — C61 Malignant neoplasm of prostate: Secondary | ICD-10-CM | POA: Diagnosis not present

## 2019-06-13 DIAGNOSIS — E7849 Other hyperlipidemia: Secondary | ICD-10-CM | POA: Diagnosis not present

## 2019-06-13 DIAGNOSIS — R5381 Other malaise: Secondary | ICD-10-CM | POA: Diagnosis not present

## 2019-06-13 DIAGNOSIS — I10 Essential (primary) hypertension: Secondary | ICD-10-CM | POA: Diagnosis not present

## 2019-06-17 ENCOUNTER — Encounter: Payer: Self-pay | Admitting: Emergency Medicine

## 2019-06-17 ENCOUNTER — Emergency Department: Payer: Medicare PPO

## 2019-06-17 ENCOUNTER — Other Ambulatory Visit: Payer: Self-pay

## 2019-06-17 ENCOUNTER — Inpatient Hospital Stay
Admission: EM | Admit: 2019-06-17 | Discharge: 2019-06-21 | DRG: 177 | Disposition: A | Discharge status: Home Health | Payer: Medicare PPO | Attending: Internal Medicine | Admitting: Internal Medicine

## 2019-06-17 DIAGNOSIS — L8990 Pressure ulcer of unspecified site, unspecified stage: Secondary | ICD-10-CM | POA: Insufficient documentation

## 2019-06-17 DIAGNOSIS — Z7982 Long term (current) use of aspirin: Secondary | ICD-10-CM

## 2019-06-17 DIAGNOSIS — J1282 Pneumonia due to coronavirus disease 2019: Secondary | ICD-10-CM | POA: Diagnosis present

## 2019-06-17 DIAGNOSIS — M2550 Pain in unspecified joint: Secondary | ICD-10-CM | POA: Diagnosis not present

## 2019-06-17 DIAGNOSIS — Z87440 Personal history of urinary (tract) infections: Secondary | ICD-10-CM

## 2019-06-17 DIAGNOSIS — R410 Disorientation, unspecified: Secondary | ICD-10-CM

## 2019-06-17 DIAGNOSIS — Z23 Encounter for immunization: Secondary | ICD-10-CM | POA: Diagnosis not present

## 2019-06-17 DIAGNOSIS — G9341 Metabolic encephalopathy: Secondary | ICD-10-CM | POA: Diagnosis not present

## 2019-06-17 DIAGNOSIS — E872 Acidosis: Secondary | ICD-10-CM | POA: Diagnosis present

## 2019-06-17 DIAGNOSIS — F0390 Unspecified dementia without behavioral disturbance: Secondary | ICD-10-CM | POA: Diagnosis present

## 2019-06-17 DIAGNOSIS — E1165 Type 2 diabetes mellitus with hyperglycemia: Secondary | ICD-10-CM | POA: Diagnosis not present

## 2019-06-17 DIAGNOSIS — T380X5A Adverse effect of glucocorticoids and synthetic analogues, initial encounter: Secondary | ICD-10-CM | POA: Diagnosis not present

## 2019-06-17 DIAGNOSIS — R0902 Hypoxemia: Secondary | ICD-10-CM | POA: Diagnosis not present

## 2019-06-17 DIAGNOSIS — Z7984 Long term (current) use of oral hypoglycemic drugs: Secondary | ICD-10-CM

## 2019-06-17 DIAGNOSIS — E785 Hyperlipidemia, unspecified: Secondary | ICD-10-CM | POA: Diagnosis present

## 2019-06-17 DIAGNOSIS — E870 Hyperosmolality and hypernatremia: Secondary | ICD-10-CM | POA: Diagnosis not present

## 2019-06-17 DIAGNOSIS — R6510 Systemic inflammatory response syndrome (SIRS) of non-infectious origin without acute organ dysfunction: Secondary | ICD-10-CM | POA: Diagnosis not present

## 2019-06-17 DIAGNOSIS — R1310 Dysphagia, unspecified: Secondary | ICD-10-CM | POA: Diagnosis not present

## 2019-06-17 DIAGNOSIS — U071 COVID-19: Principal | ICD-10-CM | POA: Diagnosis present

## 2019-06-17 DIAGNOSIS — Z79899 Other long term (current) drug therapy: Secondary | ICD-10-CM

## 2019-06-17 DIAGNOSIS — I509 Heart failure, unspecified: Secondary | ICD-10-CM

## 2019-06-17 DIAGNOSIS — E860 Dehydration: Secondary | ICD-10-CM | POA: Diagnosis present

## 2019-06-17 DIAGNOSIS — N179 Acute kidney failure, unspecified: Secondary | ICD-10-CM | POA: Diagnosis not present

## 2019-06-17 DIAGNOSIS — L89150 Pressure ulcer of sacral region, unstageable: Secondary | ICD-10-CM | POA: Diagnosis present

## 2019-06-17 DIAGNOSIS — J9601 Acute respiratory failure with hypoxia: Secondary | ICD-10-CM | POA: Diagnosis present

## 2019-06-17 DIAGNOSIS — I10 Essential (primary) hypertension: Secondary | ICD-10-CM | POA: Diagnosis present

## 2019-06-17 DIAGNOSIS — D500 Iron deficiency anemia secondary to blood loss (chronic): Secondary | ICD-10-CM | POA: Diagnosis present

## 2019-06-17 DIAGNOSIS — R52 Pain, unspecified: Secondary | ICD-10-CM | POA: Diagnosis not present

## 2019-06-17 DIAGNOSIS — J9600 Acute respiratory failure, unspecified whether with hypoxia or hypercapnia: Secondary | ICD-10-CM | POA: Diagnosis not present

## 2019-06-17 DIAGNOSIS — R000 Tachycardia, unspecified: Secondary | ICD-10-CM | POA: Diagnosis not present

## 2019-06-17 DIAGNOSIS — Z79891 Long term (current) use of opiate analgesic: Secondary | ICD-10-CM

## 2019-06-17 DIAGNOSIS — M6281 Muscle weakness (generalized): Secondary | ICD-10-CM | POA: Diagnosis not present

## 2019-06-17 DIAGNOSIS — R4182 Altered mental status, unspecified: Secondary | ICD-10-CM | POA: Diagnosis present

## 2019-06-17 DIAGNOSIS — E119 Type 2 diabetes mellitus without complications: Secondary | ICD-10-CM

## 2019-06-17 DIAGNOSIS — E1169 Type 2 diabetes mellitus with other specified complication: Secondary | ICD-10-CM | POA: Diagnosis present

## 2019-06-17 DIAGNOSIS — R404 Transient alteration of awareness: Secondary | ICD-10-CM | POA: Diagnosis not present

## 2019-06-17 DIAGNOSIS — Z7401 Bed confinement status: Secondary | ICD-10-CM | POA: Diagnosis not present

## 2019-06-17 DIAGNOSIS — I248 Other forms of acute ischemic heart disease: Secondary | ICD-10-CM | POA: Diagnosis present

## 2019-06-17 DIAGNOSIS — R509 Fever, unspecified: Secondary | ICD-10-CM | POA: Diagnosis not present

## 2019-06-17 DIAGNOSIS — R069 Unspecified abnormalities of breathing: Secondary | ICD-10-CM | POA: Diagnosis not present

## 2019-06-17 HISTORY — DX: COVID-19: U07.1

## 2019-06-17 LAB — TROPONIN I (HIGH SENSITIVITY): Troponin I (High Sensitivity): 75 ng/L — ABNORMAL HIGH (ref <18)

## 2019-06-17 LAB — URINALYSIS, COMPLETE (UACMP) WITH MICROSCOPIC
Bilirubin Urine: NEGATIVE
Glucose, UA: NEGATIVE mg/dL
Hgb urine dipstick: NEGATIVE
Ketones, ur: NEGATIVE mg/dL
Leukocytes,Ua: NEGATIVE
Nitrite: NEGATIVE
Protein, ur: NEGATIVE mg/dL
Specific Gravity, Urine: 1.016 (ref 1.005–1.030)
Squamous Epithelial / HPF: NONE SEEN (ref 0–5)
pH: 5 (ref 5.0–8.0)

## 2019-06-17 LAB — CBC WITH DIFFERENTIAL/PLATELET
Abs Immature Granulocytes: 0.1 10*3/uL — ABNORMAL HIGH (ref 0.00–0.07)
Basophils Absolute: 0 10*3/uL (ref 0.0–0.1)
Basophils Relative: 0 %
Eosinophils Absolute: 0.1 10*3/uL (ref 0.0–0.5)
Eosinophils Relative: 0 %
HCT: 23.6 % — ABNORMAL LOW (ref 39.0–52.0)
HCT: 24.1 % — ABNORMAL LOW (ref 39.0–52.0)
Hemoglobin: 6.4 g/dL — ABNORMAL LOW (ref 13.0–17.0)
Hemoglobin: 6.5 g/dL — ABNORMAL LOW (ref 13.0–17.0)
Immature Granulocytes: 1 %
Lymphocytes Relative: 9 %
Lymphs Abs: 1.3 10*3/uL (ref 0.7–4.0)
MCH: 18.1 pg — ABNORMAL LOW (ref 26.0–34.0)
MCH: 19 pg — ABNORMAL LOW (ref 26.0–34.0)
MCHC: 26.6 g/dL — ABNORMAL LOW (ref 30.0–36.0)
MCHC: 27.5 g/dL — ABNORMAL LOW (ref 30.0–36.0)
MCV: 68.3 fL — ABNORMAL LOW (ref 80.0–100.0)
MCV: 69 fL — ABNORMAL LOW (ref 80.0–100.0)
Monocytes Absolute: 1.6 10*3/uL — ABNORMAL HIGH (ref 0.1–1.0)
Monocytes Relative: 12 %
Neutro Abs: 11.1 10*3/uL — ABNORMAL HIGH (ref 1.7–7.7)
Neutrophils Relative %: 78 %
Platelets: 520 10*3/uL — ABNORMAL HIGH (ref 150–400)
Platelets: 520 10*3/uL — ABNORMAL HIGH (ref 150–400)
RBC: 3.42 MIL/uL — ABNORMAL LOW (ref 4.22–5.81)
RBC: 3.53 MIL/uL — ABNORMAL LOW (ref 4.22–5.81)
RDW: 18.6 % — ABNORMAL HIGH (ref 11.5–15.5)
RDW: 18.7 % — ABNORMAL HIGH (ref 11.5–15.5)
Smear Review: NORMAL
WBC: 13.9 10*3/uL — ABNORMAL HIGH (ref 4.0–10.5)
WBC: 14.2 10*3/uL — ABNORMAL HIGH (ref 4.0–10.5)
nRBC: 0 % (ref 0.0–0.2)
nRBC: 0.1 % (ref 0.0–0.2)

## 2019-06-17 LAB — ABO/RH: ABO/RH(D): O POS

## 2019-06-17 LAB — COMPREHENSIVE METABOLIC PANEL
ALT: 25 U/L (ref 0–44)
AST: 34 U/L (ref 15–41)
Albumin: 2.4 g/dL — ABNORMAL LOW (ref 3.5–5.0)
Alkaline Phosphatase: 114 U/L (ref 38–126)
Anion gap: 13 (ref 5–15)
BUN: 36 mg/dL — ABNORMAL HIGH (ref 8–23)
CO2: 19 mmol/L — ABNORMAL LOW (ref 22–32)
Calcium: 8.6 mg/dL — ABNORMAL LOW (ref 8.9–10.3)
Chloride: 111 mmol/L (ref 98–111)
Creatinine, Ser: 1.38 mg/dL — ABNORMAL HIGH (ref 0.61–1.24)
GFR calc Af Amer: 47 mL/min — ABNORMAL LOW (ref >60)
GFR calc non Af Amer: 41 mL/min — ABNORMAL LOW (ref >60)
Glucose, Bld: 195 mg/dL — ABNORMAL HIGH (ref 70–99)
Potassium: 4.3 mmol/L (ref 3.5–5.1)
Sodium: 143 mmol/L (ref 135–145)
Total Bilirubin: 0.9 mg/dL (ref 0.3–1.2)
Total Protein: 7.4 g/dL (ref 6.5–8.1)

## 2019-06-17 LAB — BLOOD GAS, VENOUS
Acid-base deficit: 3.9 mmol/L — ABNORMAL HIGH (ref 0.0–2.0)
Bicarbonate: 20.8 mmol/L (ref 20.0–28.0)
O2 Saturation: 52.7 %
Patient temperature: 37
pCO2, Ven: 36 mmHg — ABNORMAL LOW (ref 44.0–60.0)
pH, Ven: 7.37 (ref 7.250–7.430)

## 2019-06-17 LAB — LACTATE DEHYDROGENASE: LDH: 242 U/L — ABNORMAL HIGH (ref 98–192)

## 2019-06-17 LAB — PROCALCITONIN: Procalcitonin: 0.74 ng/mL

## 2019-06-17 LAB — FERRITIN: Ferritin: 20 ng/mL — ABNORMAL LOW (ref 24–336)

## 2019-06-17 LAB — POC SARS CORONAVIRUS 2 AG: SARS Coronavirus 2 Ag: NEGATIVE

## 2019-06-17 LAB — RESPIRATORY PANEL BY RT PCR (FLU A&B, COVID)
Influenza A by PCR: NEGATIVE
Influenza B by PCR: NEGATIVE
SARS Coronavirus 2 by RT PCR: POSITIVE — AB

## 2019-06-17 LAB — LACTIC ACID, PLASMA
Lactic Acid, Venous: 2.8 mmol/L (ref 0.5–1.9)
Lactic Acid, Venous: 6.9 mmol/L (ref 0.5–1.9)

## 2019-06-17 LAB — PREPARE RBC (CROSSMATCH)

## 2019-06-17 LAB — BRAIN NATRIURETIC PEPTIDE: B Natriuretic Peptide: 127 pg/mL — ABNORMAL HIGH (ref 0.0–100.0)

## 2019-06-17 LAB — C-REACTIVE PROTEIN: CRP: 34.8 mg/dL — ABNORMAL HIGH (ref <1.0)

## 2019-06-17 MED ORDER — SODIUM CHLORIDE 0.9 % IV BOLUS (SEPSIS)
1000.0000 mL | Freq: Once | INTRAVENOUS | Status: AC
  Administered 2019-06-17: 17:00:00 1000 mL via INTRAVENOUS

## 2019-06-17 MED ORDER — ONDANSETRON HCL 4 MG/2ML IJ SOLN: 4.0000 mg | Freq: Four times a day (QID) | INTRAMUSCULAR | Status: DC | PRN

## 2019-06-17 MED ORDER — VANCOMYCIN HCL IN DEXTROSE 1-5 GM/200ML-% IV SOLN
1000.0000 mg | Freq: Once | INTRAVENOUS | Status: AC
  Administered 2019-06-17: 18:00:00 1000 mg via INTRAVENOUS
  Filled 2019-06-17: qty 200

## 2019-06-17 MED ORDER — INSULIN ASPART 100 UNIT/ML ~~LOC~~ SOLN
0.0000 [IU] | Freq: Three times a day (TID) | SUBCUTANEOUS | Status: DC
  Administered 2019-06-18: 11:00:00 7 [IU] via SUBCUTANEOUS
  Administered 2019-06-19 (×2): 5 [IU] via SUBCUTANEOUS
  Filled 2019-06-17 (×3): qty 1

## 2019-06-17 MED ORDER — PANTOPRAZOLE SODIUM 40 MG IV SOLR
80.0000 mg | Freq: Once | INTRAVENOUS | Status: AC
  Administered 2019-06-17: 21:00:00 80 mg via INTRAVENOUS
  Filled 2019-06-17: qty 80

## 2019-06-17 MED ORDER — HYDROCOD POLST-CPM POLST ER 10-8 MG/5ML PO SUER: 5.0000 mL | Freq: Two times a day (BID) | ORAL | Status: DC | PRN

## 2019-06-17 MED ORDER — DEXAMETHASONE SODIUM PHOSPHATE 10 MG/ML IJ SOLN
8.0000 mg | Freq: Once | INTRAMUSCULAR | Status: DC
  Administered 2019-06-17: 8 mg via INTRAVENOUS
  Filled 2019-06-17: qty 1

## 2019-06-17 MED ORDER — SODIUM CHLORIDE 0.9 % IV SOLN
100.0000 mg | Freq: Every day | INTRAVENOUS | Status: AC
  Administered 2019-06-18 – 2019-06-21 (×4): 100 mg via INTRAVENOUS
  Filled 2019-06-17 (×5): qty 20

## 2019-06-17 MED ORDER — SODIUM CHLORIDE 0.9 % IV SOLN: Freq: Once | INTRAVENOUS | Status: DC

## 2019-06-17 MED ORDER — SODIUM CHLORIDE 0.9 % IV SOLN
2.0000 g | Freq: Once | INTRAVENOUS | Status: AC
  Administered 2019-06-17: 17:00:00 2 g via INTRAVENOUS
  Filled 2019-06-17: qty 2

## 2019-06-17 MED ORDER — IPRATROPIUM-ALBUTEROL 20-100 MCG/ACT IN AERS
1.0000 | INHALATION_SPRAY | Freq: Four times a day (QID) | RESPIRATORY_TRACT | Status: DC
  Administered 2019-06-18 – 2019-06-21 (×10): 1 via RESPIRATORY_TRACT
  Filled 2019-06-17: qty 4

## 2019-06-17 MED ORDER — SODIUM CHLORIDE 0.9% IV SOLUTION
Freq: Once | INTRAVENOUS | Status: AC
  Filled 2019-06-17: qty 250

## 2019-06-17 MED ORDER — DEXAMETHASONE SODIUM PHOSPHATE 10 MG/ML IJ SOLN
6.0000 mg | INTRAMUSCULAR | Status: DC
  Administered 2019-06-18: 6 mg via INTRAVENOUS
  Filled 2019-06-17: qty 1

## 2019-06-17 MED ORDER — SODIUM CHLORIDE 0.9 % IV SOLN
200.0000 mg | Freq: Once | INTRAVENOUS | Status: AC
  Administered 2019-06-17: 23:00:00 200 mg via INTRAVENOUS
  Filled 2019-06-17: qty 200

## 2019-06-17 MED ORDER — SODIUM CHLORIDE 0.9 % IV SOLN: INTRAVENOUS | Status: AC

## 2019-06-17 MED ORDER — PANTOPRAZOLE SODIUM 40 MG IV SOLR
40.0000 mg | Freq: Two times a day (BID) | INTRAVENOUS | Status: DC
  Administered 2019-06-18 – 2019-06-21 (×7): 40 mg via INTRAVENOUS
  Filled 2019-06-17 (×7): qty 40

## 2019-06-17 MED ORDER — ACETAMINOPHEN 325 MG PO TABS
650.0000 mg | ORAL_TABLET | Freq: Four times a day (QID) | ORAL | Status: DC | PRN
  Administered 2019-06-20: 08:00:00 650 mg via ORAL
  Filled 2019-06-17: qty 2

## 2019-06-17 MED ORDER — ASCORBIC ACID 500 MG PO TABS
500.0000 mg | ORAL_TABLET | Freq: Every day | ORAL | Status: DC
  Administered 2019-06-19 – 2019-06-20 (×2): 500 mg via ORAL
  Filled 2019-06-17 (×3): qty 1

## 2019-06-17 MED ORDER — SODIUM CHLORIDE 0.9 % IV BOLUS
500.0000 mL | Freq: Once | INTRAVENOUS | Status: AC
  Administered 2019-06-17: 19:00:00 500 mL via INTRAVENOUS

## 2019-06-17 MED ORDER — GUAIFENESIN-DM 100-10 MG/5ML PO SYRP
10.0000 mL | ORAL_SOLUTION | ORAL | Status: DC | PRN
  Filled 2019-06-17: qty 10

## 2019-06-17 MED ORDER — SODIUM CHLORIDE 0.9 % IV BOLUS
1000.0000 mL | Freq: Once | INTRAVENOUS | Status: AC
  Administered 2019-06-17: 23:00:00 1000 mL via INTRAVENOUS

## 2019-06-17 MED ORDER — ZINC SULFATE 220 (50 ZN) MG PO CAPS
220.0000 mg | ORAL_CAPSULE | Freq: Every day | ORAL | Status: DC
  Administered 2019-06-18 – 2019-06-20 (×3): 220 mg via ORAL
  Filled 2019-06-17 (×3): qty 1

## 2019-06-17 MED ORDER — ONDANSETRON HCL 4 MG PO TABS: 4.0000 mg | ORAL_TABLET | Freq: Four times a day (QID) | ORAL | Status: DC | PRN

## 2019-06-17 MED ORDER — ACETAMINOPHEN 500 MG PO TABS
1000.0000 mg | ORAL_TABLET | Freq: Once | ORAL | Status: AC
  Administered 2019-06-17: 19:00:00 1000 mg via ORAL
  Filled 2019-06-17: qty 2

## 2019-06-17 MED ORDER — SODIUM CHLORIDE 0.9% FLUSH
3.0000 mL | Freq: Two times a day (BID) | INTRAVENOUS | Status: DC
  Administered 2019-06-18 – 2019-06-21 (×6): 3 mL via INTRAVENOUS

## 2019-06-17 MED ORDER — PRAVASTATIN SODIUM 20 MG PO TABS
40.0000 mg | ORAL_TABLET | Freq: Every day | ORAL | Status: DC
  Administered 2019-06-18 – 2019-06-20 (×3): 40 mg via ORAL
  Filled 2019-06-17 (×2): qty 2
  Filled 2019-06-17 (×2): qty 1
  Filled 2019-06-17: qty 2

## 2019-06-17 NOTE — ED Notes (Signed)
Pt won't keep mask on at this time, pt confused.

## 2019-06-17 NOTE — ED Notes (Signed)
Primary nurse and EDP notified of positive test

## 2019-06-17 NOTE — Consult Note (Signed)
Remdesivir - Pharmacy Brief Note   O:  ALT: 25 CXR: Evidence of lower respiratory infection on chest imaging SpO2: 100% on 4L   A/P:  Remdesivir 200 mg IVPB once followed by 100 mg IVPB daily x 4 days.   Albina Billet, PharmD, BCPS Clinical Pharmacist 06/17/2019 9:30 PM

## 2019-06-17 NOTE — ED Provider Notes (Signed)
[facility] Emergency Department Provider Note  ____________________________________________   First MD Initiated Contact with Patient 06/17/19 1611     (approximate)  I have reviewed the triage vital signs and the nursing notes.  History  Chief Complaint Altered Mental Status    HPI Frank Delgado is a 86 y.o. male with history of HF, DM, HTN, previously independent, alert and oriented x3, who presents to the emergency department for progressive decline x several weeks, confusion x several days, and fever noted today.  Per EMS report, patient lives at home with family.  Over the last 2 weeks, he has had a significant, progressive decline, including generalized weakness, confusion, disorientation.  Recently completed a course of antibiotics for UTI a few weeks ago. Today he was more confused than normal, prompting family to seek care. They state a few weeks ago, he was independent, could fix his own coffee, hold a conversation.   One of his caregivers reportedly did recently test positive for COVID within the last month.    Patient found to be febrile, tachycardic with EMS.  Patient is altered, localizes to pain, but provides no history.  Caveat: history limited due to patient's clinical status, obtained primarily from EMS and grandson.   Past Medical Hx Past Medical History:  Diagnosis Date  . CHF (congestive heart failure) (HCC)   . Diabetes mellitus without complication (HCC)   . Hypertension     Problem List Patient Active Problem List   Diagnosis Date Noted  . Back pain 01/31/2016  . Generalized weakness 01/31/2016  . Malignant essential hypertension 01/31/2016  . Abnormal EKG 01/31/2016  . Anasarca 06/17/2015    Past Surgical Hx Past Surgical History:  Procedure Laterality Date  . HIP SURGERY    . STOMACH SURGERY      Medications Prior to Admission medications   Medication Sig Start Date End Date Taking? Authorizing Provider    aspirin EC 81 MG EC tablet Take 1 tablet (81 mg total) by mouth daily. 06/20/15   Enedina Finner, MD  docusate sodium (COLACE) 100 MG capsule Take 1 capsule (100 mg total) by mouth 2 (two) times daily. 06/20/15   Enedina Finner, MD  folic acid (FOLVITE) 1 MG tablet Take 1 tablet (1 mg total) by mouth daily. 06/20/15   Enedina Finner, MD  furosemide (LASIX) 40 MG tablet Take 40 mg by mouth daily.     [provider]  glimepiride (AMARYL) 2 MG tablet Take 2 mg by mouth daily with breakfast.     [provider]  lisinopril (PRINIVIL,ZESTRIL) 10 MG tablet Take 10 mg by mouth daily.     [provider]  potassium chloride (K-DUR) 10 MEQ tablet Take 10 mEq by mouth daily.     [provider]  pravastatin (PRAVACHOL) 40 MG tablet Take 40 mg by mouth at bedtime.     [provider]  traMADol Janean Sark) 50 MG tablet Take 1-2 tablets by mouth every 6 hours as needed for moderate to severe pain 09/28/15   Loleta Rose, MD    Allergies Patient has no known allergies.  Family Hx History reviewed. No pertinent family history.  Social Hx Social History   Tobacco Use  . Smoking status: Never Smoker  . Smokeless tobacco: Never Used  Substance Use Topics  . Alcohol use: No  . Drug use: Not on file     Review of Systems Unable to obtain due to patient's altered mental status.  Physical Exam  Vital  Signs: ED Triage Vitals [06/17/19 1617]  Enc Vitals Group     BP      Pulse Rate (!) 111     Resp (!) 28     Temp (!) 101.4 F (38.6 C)     Temp Source Rectal     SpO2 96 %     Weight      Height      Head Circumference      Peak Flow      Pain Score      Pain Loc      Pain Edu?      Excl. in [place]?     Constitutional: Awake, localizes pain, otherwise is not oriented. Head: Normocephalic. Atraumatic. Eyes: Conjunctivae clear. Sclera anicteric. Nose: No congestion. No rhinorrhea. Mouth/Throat: Wearing mask.  MM extremely dry.   Neck: No stridor.    Cardiovascular: Tachycardic, regular rhythm. Extremities well perfused. Respiratory: RR mid to upper 20s.  Lungs CTAB.  Arrives on 2 L [place] by EMS for oxygen low 90s on RA. Gastrointestinal: Soft. Non-tender. Non-distended.  Rectal: Grandson consented to rectal exam. RN chaperone present. Black stool, guaiac positive.  Musculoskeletal: No lower extremity edema. No deformities. Neurologic:  Normal speech and language. No gross focal neurologic deficits are appreciated.  Skin: Mild skin break down at the sacral area. Psychiatric: Mood and affect are appropriate for situation.  EKG  Personally reviewed.   Rate: 114 Rhythm: sinus Axis: normal Intervals: WNL Sinus tachycardia No STEMI    Radiology  CXR: IMPRESSION:  Patchy bilateral airspace disease, left greater than right  concerning for pneumonia.    Procedures  Procedure(s) performed (including critical care):  .Critical Care Performed by: Lilia Pro., MD Authorized by: Lilia Pro., MD   Critical care provider statement:    Critical care time (minutes):  45   Critical care was necessary to treat or prevent imminent or life-threatening deterioration of the following conditions: sepsis, GIB.   Critical care was time spent personally by me on the following activities:  Discussions with consultants, evaluation of patient's response to treatment, examination of patient, ordering and performing treatments and interventions, ordering and review of laboratory studies, ordering and review of radiographic studies, pulse oximetry, re-evaluation of patient's condition, obtaining history from patient or surrogate and review of old charts     Initial Impression / Assessment and Plan / ED Course  86 y.o. male who presents to the ED for confusion, AMS above baseline, found to be febrile, tachycardic with EMS.  Patient meets SIRS criteria on arrival.  Ddx: encephalopathy secondary to infection, such as UTI, pneumonia, COVID  versus electrolyte abnormality versus dehydration  Labs reveal leukocytosis of 14.2.  Also notable for anemia, hemoglobin 6.5.  Rectal does reveal black stool, guaiac positive.  Will give dose of PPI and transfuse 1 unit.  Grandson provided consent.   Creatinine 1.38 from normal previously.  Lactic acid 2.8.  CXR concerning for multifocal pneumonia.  Receiving IV fluids, empiric antibiotics given he meets sepsis criteria on arrival, though this could be a COVID PNA.  Awaiting COVID swab results, then will plan for admission.  COVID positive. Steroids ordered, pharmacy consult for remdesivir. On 2 L. Will admit.    Final Clinical Impression(s) / ED Diagnosis  Final diagnoses:  Confusion  SIRS (systemic inflammatory response syndrome) (HCC)  AKI (acute kidney injury) ([place])  COVID-19       Note:  This document was prepared using Dragon voice recognition software and may include  unintentional dictation errors.   Miguel Aschoff., MD 06/17/19 2119

## 2019-06-17 NOTE — ED Notes (Signed)
This RN received update from Lab that blood was ready for pt.

## 2019-06-17 NOTE — ED Notes (Signed)
Date and time results received: 06/17/19 1754  Test: Lactic Acid  Critical Value: 2.8  Name of Provider Notified: Dr. Colon Branch, MD

## 2019-06-17 NOTE — ED Notes (Signed)
Spoke with Volanda Napoleon, verbal consent via telephone given to administer blood transfusion. Verified with Dr. Colon Branch.

## 2019-06-17 NOTE — ED Triage Notes (Signed)
Pt via EMS from home. Per EMS, family called out for difficulty breathing. Per family, pt usually ambulated and A&Ox4. Pt has recently been treated for a UTI. Since then pt has become more lethargic and unable to walk. On arrival, pt is very lethargic and constantly calling out

## 2019-06-17 NOTE — H&P (Signed)
History and Physical    Frank Delgado GGY:[id number] DOB: May 29, 1914 DOA: 06/17/2019  PCP: Corky Downs, MD  Patient coming from: Home  I have personally briefly reviewed patient's old medical records in [facility]  Chief Complaint: Fevers, dyspnea, altered mental status  HPI: Frank Delgado is a 86 y.o. male with medical history significant for CHF, type 2 diabetes, hypertension, hyperlipidemia, and iron deficiency anemia who presents to the ED for evaluation of fevers, dyspnea, and altered mental status.  Patient unable to provide history due to altered mental status therefore entire history is obtained from EDP, chart review, and grandson by phone.  Lucila Maine states that about 3 weeks ago his grandfather was doing very well unable to ambulate with the assistance of a walker.  About 2 weeks ago he was diagnosed with a UTI and started on antibiotic therapy.  He has a home health nurse who comes when needed and there is a question of having a left-sided pneumonia and therefore his antibiotic regimen was adjusted.  During this time span he has been having increased weakness and lethargy and has not really been walking.  He has had decreased oral intake.  2 days ago his grandson called paramedics for further evaluation and he had reportedly good vitals and oxygen saturation.  EMS were again called today and he was noted to have hypoxia and he was given a breathing treatment and brought to the ED for further evaluation.  ED Course:  Initial vitals showed BP 100/76, pulse 110, RR 32, temp 101.4 Fahrenheit rectally, SPO2 96% on 2 L supplemental O2 via [place].  Labs are notable for BUN 36, creatinine 1.38, sodium 143, potassium 4.3, bicarb 19, serum glucose 195, WBC 13.9, hemoglobin 6.4, MCV 68.3, RDW 18.7, platelets 520,000, procalcitonin 0.74, LDH 242, ferritin 20, high-sensitivity troponin I 75, BNP 127, lactic acid 2.8.  VBG showed pH 7.37, PCO2 36, PO2 52.7.  Urinalysis showed negative nitrites,  negative leukocytes, 0-5 WBCs, rare bacteria microscopy.  Blood and urine cultures were obtained and pending.  SARS-CoV-2 antigen test was negative.  SARS-CoV-2 PCR test was positive.  Influenza A/B tests were negative.  Portable chest x-ray showed patchy bilateral airspace opacities, left greater than right.  Patient was treated as sepsis protocol on arrival and given 1.5 L normal saline, IV vancomycin and cefepime.  Per EDP, rectal exam showed black stool which was guaiac positive.  Patient was given IV Protonix 80 mg once.  Patient was ordered to receive 1 unit transfusion of PRBC.  Patient also ordered to receive IV Decadron 8 mg once and remdesivir per pharmacy protocol.  Repeat lactic acid was 6.9.  The hospitalist service was consulted to admit for further evaluation and management.  Review of Systems:  Unable to obtain full review of systems due to patient's mental status.   Past Medical History:  Diagnosis Date  . CHF (congestive heart failure) (HCC)   . COVID-19   . Diabetes mellitus without complication (HCC)   . Hypertension     Past Surgical History:  Procedure Laterality Date  . HIP SURGERY    . STOMACH SURGERY      Social History:  reports that he has never smoked. He has never used smokeless tobacco. He reports that he does not drink alcohol. No history on file for drug.  No Known Allergies  Family History  Problem Relation Age of Onset  . Gout Son      Prior to Admission medications   Medication Sig Start Date  End Date Taking? Authorizing Provider  amLODipine (NORVASC) 5 MG tablet Take 5 mg by mouth daily. 05/09/19  Yes [provider]  FERREX 150 FORTE 150-1-25 MG-MG-MCG CAPS Take 1 capsule by mouth daily. 05/30/19  Yes [provider]  furosemide (LASIX) 20 MG tablet Take 40 mg by mouth daily.    Yes [provider]  gabapentin (NEURONTIN) 100 MG capsule Take 100 mg by mouth 2 (two) times daily. 05/25/19  Yes [provider]  glimepiride (AMARYL) 2 MG tablet Take 2 mg by mouth daily with breakfast.    Yes [provider]  lisinopril (ZESTRIL) 20 MG tablet Take 20 mg by mouth daily. 05/19/19  Yes [provider]  potassium chloride (K-DUR) 10 MEQ tablet Take 10 mEq by mouth daily.    Yes [provider]  pravastatin (PRAVACHOL) 40 MG tablet Take 40 mg by mouth at bedtime.    Yes [provider]  aspirin EC 81 MG EC tablet Take 1 tablet (81 mg total) by mouth daily. 06/20/15   Fritzi Mandes, MD    Physical Exam: Vitals:   06/17/19 2030 06/17/19 2102 06/17/19 2106 06/17/19 2133  BP: 136/79 (!) 116/95  135/62  Pulse:  (!) 102  93  Resp: 20 (!) 21  (!) 24  Temp:  98.4 F (36.9 C)  98.4 F (36.9 C)  TempSrc:  Rectal  Axillary  SpO2:  100%  98%  Weight:   81.6 kg   Height:   5\' 10"  (1.778 m)     Constitutional: Elderly man resting supine in bed, delirious but calm Eyes: PERRL, lids and conjunctivae normal ENMT: Mucous membranes are dry. Posterior pharynx clear of any exudate or lesions.Normal dentition.  Neck: normal, supple, no masses. Respiratory: Coarse breath sounds anteriorly. Normal respiratory effort. No accessory muscle use.  Cardiovascular: Regular rate and rhythm, no murmurs / rubs / gallops. No extremity edema. 2+ pedal pulses. Abdomen: no tenderness, no masses palpated. No hepatosplenomegaly. Bowel sounds positive.  Musculoskeletal: no clubbing / cyanosis. No joint deformity upper and lower extremities. Good ROM, no contractures. Normal muscle tone.  Skin: no rashes, lesions, ulcers. No induration Neurologic: Sensation appears intact, moving all extremities spontaneously Psychiatric: Awake and alert but disoriented.   Labs on Admission: I have personally reviewed following labs and imaging studies  CBC: Recent Labs  Lab 06/17/19 1631  WBC 14.2*  13.9*  NEUTROABS 11.1*  ORDER MODIFIED OR RESCHEDULED  HGB 6.5*  6.4*  HCT 23.6*  24.1*  MCV 69.0*   68.3*  PLT 520*  341*   Basic Metabolic Panel: Recent Labs  Lab 06/17/19 1631  NA 143  K 4.3  CL 111  CO2 19*  GLUCOSE 195*  BUN 36*  CREATININE 1.38*  CALCIUM 8.6*   GFR: Estimated Creatinine Clearance: 27.2 mL/min (A) (by C-G formula based on SCr of 1.38 mg/dL (H)). Liver Function Tests: Recent Labs  Lab 06/17/19 1631  AST 34  ALT 25  ALKPHOS 114  BILITOT 0.9  PROT 7.4  ALBUMIN 2.4*   No results for input(s): LIPASE, AMYLASE in the last 168 hours. No results for input(s): AMMONIA in the last 168 hours. Coagulation Profile: No results for input(s): INR, PROTIME in the last 168 hours. Cardiac Enzymes: No results for input(s): CKTOTAL, CKMB, CKMBINDEX, TROPONINI in the last 168 hours. BNP (last 3 results) No results for input(s): PROBNP in the last 8760 hours. HbA1C: No results for input(s): HGBA1C in the last 72 hours. CBG: No results  for input(s): GLUCAP in the last 168 hours. Lipid Profile: No results for input(s): CHOL, HDL, LDLCALC, TRIG, CHOLHDL, LDLDIRECT in the last 72 hours. Thyroid Function Tests: No results for input(s): TSH, T4TOTAL, FREET4, T3FREE, THYROIDAB in the last 72 hours. Anemia Panel: Recent Labs    06/17/19 1631  FERRITIN 20*   Urine analysis:    Component Value Date/Time   COLORURINE YELLOW (A) 06/17/2019 1719   APPEARANCEUR CLEAR (A) 06/17/2019 1719   APPEARANCEUR Turbid 03/28/2012 1359   LABSPEC 1.016 06/17/2019 1719   LABSPEC 1.009 03/28/2012 1359   PHURINE 5.0 06/17/2019 1719   GLUCOSEU NEGATIVE 06/17/2019 1719   GLUCOSEU 50 mg/dL 82/99/3716 9678   HGBUR NEGATIVE 06/17/2019 1719   BILIRUBINUR NEGATIVE 06/17/2019 1719   BILIRUBINUR Negative 03/28/2012 1359   KETONESUR NEGATIVE 06/17/2019 1719   PROTEINUR NEGATIVE 06/17/2019 1719   NITRITE NEGATIVE 06/17/2019 1719   LEUKOCYTESUR NEGATIVE 06/17/2019 1719   LEUKOCYTESUR 3+ 03/28/2012 1359    Radiological Exams on Admission: DG Chest Port 1 View  Result Date:  06/17/2019 CLINICAL DATA:  Fever, altered mental status EXAM: PORTABLE CHEST 1 VIEW COMPARISON:  04/13/2016 FINDINGS: Patchy bilateral airspace opacities are noted, left greater than right. Heart is borderline in size. No effusions or pneumothorax. No acute bony abnormality. IMPRESSION: Patchy bilateral airspace disease, left greater than right concerning for pneumonia. Electronically Signed   By: Charlett Nose M.D.   On: 06/17/2019 17:09    EKG: Independently reviewed. Sinus tachycardia, PVC, with motion artifact  Assessment/Plan Principal Problem:   Acute hypoxemic respiratory failure due to COVID-19 [facility]) Active Problems:   AKI (acute kidney injury) (HCC)   Iron deficiency anemia due to chronic blood loss   CHF (congestive heart failure) (HCC)   Diabetes mellitus without complication (HCC)   Hyperlipidemia associated with type 2 diabetes mellitus (HCC)  Frank Delgado is a 86 y.o. male with medical history significant for CHF, type 2 diabetes, hypertension, hyperlipidemia, and iron deficiency anemia who is admitted with acute hypoxemic respiratory failure due to COVID-19 viral pneumonia.  Acute hypoxemic respiratory failure due to COVID-19 viral pneumonia: SARS-CoV-2 PCR positive 06/17/2019.  He is requiring 2-4 L supplemental O2 via [place].  Chest x-ray shows bilateral patchy airspace opacities. -Continue IV remdesivir per pharmacy protocol -Continue IV dexamethasone 6 mg daily -Continue supplemental oxygen as needed -Continue I-S, flutter valve, Combivent -Antitussives, vitamin C, zinc  Anemia likely due to chronic blood loss and iron deficiency: Hemoglobin 6.4 on arrival.  Black stool which was guaiac positive per EDP. -Transfusing 1 unit PRBC -Protonix 40 mg IV twice daily -Hold home aspirin and pharmacologic VTE prophylaxis  Acute kidney injury: Likely prerenal from dehydration in setting of COVID-19 infection and anemia. -Continue IV fluid resuscitation -Holding home Lasix  Lactic  acidosis: Remains significantly volume depleted.  Continue IV fluid resuscitation and transfusion of 1 unit PRBC as above.  Elevated troponin: Mildly elevated, likely demand ischemia from above the above.  Acute metabolic encephalopathy: Secondary to hypoxic respiratory failure.  Continue management as above.  Type 2 diabetes: Holding home Amaryl.  Continue sensitive SSI while in hospital.  Hyperlipidemia: Continue pravastatin.  Goals of care: I had a long discussion with patient's grandson Frank Delgado by phone, [phone].  I discussed the multiple medical problems as listed above including COVID-19 viral infection with hypoxic respiratory failure, anemia, acute kidney injury, and altered mental status.  I discussed that the patient is high risk for further decompensation given his multiple conditions and advanced age.  Grandson  at this time wants to continue with full scope of care including full CODE STATUS in the event of cardiac/respiratory arrest.  He wants his grandfather to return home to him by Wednesday.  DVT prophylaxis: SCDs Code Status: Full code per grandson Family Communication: Discussed with grandson Frank Delgado by phone, [phone] Disposition Plan: Pending clinical progress. Consults called: None Admission status: Inpatient for management of acute hypoxemic respiratory failure due to COVID-19 viral infection, anemia, AKI, lactic acidosis.  Patient is very high risk for further decompensation and mortality.   Darreld Mclean MD [facility]  If 7PM-7AM, please contact night-coverage [email or web address]  06/17/2019, 10:55 PM

## 2019-06-17 NOTE — ED Notes (Signed)
Per Dr. Colon Branch, no additional fluid orders at this time.

## 2019-06-17 NOTE — Consult Note (Signed)
CODE SEPSIS - PHARMACY COMMUNICATION  **Broad Spectrum Antibiotics should be administered within 1 hour of Sepsis diagnosis**  Time Code Sepsis Called/Page Received: 1616  Antibiotics Ordered: Cefepime/Vancomycin  Time of 1st antibiotic administration: 1703  Additional action taken by pharmacy: Called nurse  If necessary, Name of Provider/Nurse Contacted: Celine Mans, PharmD, BCPS Clinical Pharmacist 06/17/2019 4:17 PM

## 2019-06-18 DIAGNOSIS — L8990 Pressure ulcer of unspecified site, unspecified stage: Secondary | ICD-10-CM | POA: Insufficient documentation

## 2019-06-18 LAB — CBC WITH DIFFERENTIAL/PLATELET
Abs Immature Granulocytes: 0.09 10*3/uL — ABNORMAL HIGH (ref 0.00–0.07)
Basophils Absolute: 0 10*3/uL (ref 0.0–0.1)
Basophils Relative: 0 %
Eosinophils Absolute: 0 10*3/uL (ref 0.0–0.5)
Eosinophils Relative: 0 %
HCT: 26.6 % — ABNORMAL LOW (ref 39.0–52.0)
Hemoglobin: 7.8 g/dL — ABNORMAL LOW (ref 13.0–17.0)
Immature Granulocytes: 1 %
Lymphocytes Relative: 4 %
Lymphs Abs: 0.4 10*3/uL — ABNORMAL LOW (ref 0.7–4.0)
MCH: 21 pg — ABNORMAL LOW (ref 26.0–34.0)
MCHC: 29.3 g/dL — ABNORMAL LOW (ref 30.0–36.0)
MCV: 71.5 fL — ABNORMAL LOW (ref 80.0–100.0)
Monocytes Absolute: 0.2 10*3/uL (ref 0.1–1.0)
Monocytes Relative: 2 %
Neutro Abs: 9.6 10*3/uL — ABNORMAL HIGH (ref 1.7–7.7)
Neutrophils Relative %: 93 %
Platelets: 359 10*3/uL (ref 150–400)
RBC: 3.72 MIL/uL — ABNORMAL LOW (ref 4.22–5.81)
RDW: 19.4 % — ABNORMAL HIGH (ref 11.5–15.5)
WBC: 10.3 10*3/uL (ref 4.0–10.5)
nRBC: 0.7 % — ABNORMAL HIGH (ref 0.0–0.2)

## 2019-06-18 LAB — BPAM RBC
Blood Product Expiration Date: 202102032359
ISSUE DATE / TIME: 202101092114
Unit Type and Rh: 5100

## 2019-06-18 LAB — TYPE AND SCREEN
ABO/RH(D): O POS
Antibody Screen: NEGATIVE
Unit division: 0

## 2019-06-18 LAB — COMPREHENSIVE METABOLIC PANEL
ALT: 22 U/L (ref 0–44)
AST: 21 U/L (ref 15–41)
Albumin: 2.1 g/dL — ABNORMAL LOW (ref 3.5–5.0)
Alkaline Phosphatase: 98 U/L (ref 38–126)
Anion gap: 13 (ref 5–15)
BUN: 37 mg/dL — ABNORMAL HIGH (ref 8–23)
CO2: 15 mmol/L — ABNORMAL LOW (ref 22–32)
Calcium: 7.7 mg/dL — ABNORMAL LOW (ref 8.9–10.3)
Chloride: 115 mmol/L — ABNORMAL HIGH (ref 98–111)
Creatinine, Ser: 1.18 mg/dL (ref 0.61–1.24)
GFR calc Af Amer: 57 mL/min — ABNORMAL LOW (ref >60)
GFR calc non Af Amer: 49 mL/min — ABNORMAL LOW (ref >60)
Glucose, Bld: 297 mg/dL — ABNORMAL HIGH (ref 70–99)
Potassium: 4.1 mmol/L (ref 3.5–5.1)
Sodium: 143 mmol/L (ref 135–145)
Total Bilirubin: 0.7 mg/dL (ref 0.3–1.2)
Total Protein: 6.8 g/dL (ref 6.5–8.1)

## 2019-06-18 LAB — LACTIC ACID, PLASMA: Lactic Acid, Venous: 2.5 mmol/L (ref 0.5–1.9)

## 2019-06-18 LAB — MAGNESIUM: Magnesium: 2.3 mg/dL (ref 1.7–2.4)

## 2019-06-18 LAB — TROPONIN I (HIGH SENSITIVITY): Troponin I (High Sensitivity): 73 ng/L — ABNORMAL HIGH (ref <18)

## 2019-06-18 LAB — FERRITIN: Ferritin: 23 ng/mL — ABNORMAL LOW (ref 24–336)

## 2019-06-18 LAB — GLUCOSE, CAPILLARY: Glucose-Capillary: 306 mg/dL — ABNORMAL HIGH (ref 70–99)

## 2019-06-18 LAB — C-REACTIVE PROTEIN: CRP: 28.1 mg/dL — ABNORMAL HIGH (ref <1.0)

## 2019-06-18 LAB — FIBRIN DERIVATIVES D-DIMER (ARMC ONLY): Fibrin derivatives D-dimer (ARMC): 3217.11 ng/mL (FEU) — ABNORMAL HIGH (ref 0.00–499.00)

## 2019-06-18 LAB — PHOSPHORUS: Phosphorus: 3.5 mg/dL (ref 2.5–4.6)

## 2019-06-18 MED ORDER — INFLUENZA VAC A&B SA ADJ QUAD 0.5 ML IM PRSY
0.5000 mL | PREFILLED_SYRINGE | INTRAMUSCULAR | Status: AC
  Administered 2019-06-21: 0.5 mL via INTRAMUSCULAR
  Filled 2019-06-18 (×2): qty 0.5

## 2019-06-18 MED ORDER — PNEUMOCOCCAL VAC POLYVALENT 25 MCG/0.5ML IJ INJ
0.5000 mL | INJECTION | INTRAMUSCULAR | Status: AC
  Administered 2019-06-21: 14:00:00 0.5 mL via INTRAMUSCULAR
  Filled 2019-06-18: qty 0.5

## 2019-06-18 NOTE — ED Notes (Signed)
Called lab to come get morning blood work- lab states they are aware of need for blood and will send someone as soon as possible

## 2019-06-18 NOTE — ED Notes (Signed)
Pt turned down to 3L [place]- pt O2 sats stay stable at 100%

## 2019-06-18 NOTE — ED Notes (Addendum)
Inside pt mouth was very dry- pt given a couple bites of applesauce and a couple sips of water- zinc given in applesauce but pt refused to eat anymore at this time so was unable to give vit C

## 2019-06-18 NOTE — ED Notes (Signed)
Pt refusing cbg check- this RN tried multiple times to explain to pt the need for cbg check and pt still refused

## 2019-06-18 NOTE — Progress Notes (Signed)
PROGRESS NOTE    Frank Delgado  ZOX:[id number] DOB: 1914/05/01 DOA: 06/17/2019 PCP: Cletis Athens, MD    Assessment & Plan:   Principal Problem:   Acute hypoxemic respiratory failure due to COVID-19 [facility]) Active Problems:   AKI (acute kidney injury) ([place])   Iron deficiency anemia due to chronic blood loss   CHF (congestive heart failure) ([place])   Diabetes mellitus without complication ([place])   Hyperlipidemia associated with type 2 diabetes mellitus ([place])   Pressure injury of skin    Frank Delgado is a 86 y.o. Caucasian male with medical history significant for CHF, type 2 diabetes, hypertension, hyperlipidemia, and iron deficiency anemia who presents to the ED for evaluation of fevers, dyspnea, and altered mental status.   Acute hypoxic respiratory failure due to COVID-19 viral pneumonia: SARS-CoV-2 PCR positive 06/17/2019.  He is requiring 4 L supplemental O2 via [place].  Chest x-ray shows bilateral patchy airspace opacities. -Continue IV remdesivir per pharmacy protocol -Continue IV dexamethasone 6 mg daily -Continue supplemental oxygen as needed -Continue I-S, flutter valve, Combivent -Antitussives, vitamin C, zinc  Anemia likely due to chronic blood loss and iron deficiency: Hemoglobin 6.4 on arrival.  Black stool which was guaiac positive per EDP. -s/p 1 unit PRBC with appropriate rise in Hgb -Protonix 40 mg IV twice daily -Hold home aspirin and pharmacologic VTE prophylaxis  Acute kidney injury, improved Likely prerenal from dehydration in setting of COVID-19 infection and anemia.  S/p IV fluid hydration. -Holding home Lasix  Lactic acidosis, improved Significantly volume depleted.  S/p IV fluid hydration and transfusion of 1 unit PRBC as above.  Elevated troponin: Mildly elevated, likely demand ischemia from above the above.  Acute metabolic encephalopathy: Secondary to hypoxic respiratory failure.  Continue management as above.  Type 2 diabetes: Holding home  [place].  Continue sensitive SSI while in hospital.  Hyperlipidemia: Continue pravastatin.  Goals of care: Admitting physician discussed with patient's grandson Wasyl Dornfeld by phone, [phone].  Grandson at this time wants to continue with full scope of care including full CODE STATUS in the event of cardiac/respiratory arrest.  He wants his grandfather to return home to him by Wednesday.   DVT prophylaxis: SCD/Compression stockings Code Status: Full code  Disposition Plan: Home, per grandson   Subjective and Interval History:  ROS can't be obtained due to pt's dementia and intelligible speech.      Objective: Vitals:   06/18/19 1630 06/18/19 1700 06/18/19 1730 06/18/19 1800  BP: 117/69 133/64 134/64 129/83  Pulse: 71 71 71 67  Resp: 16 18 18 17   Temp:      TempSrc:      SpO2: 100% 100% 100% 100%  Weight:      Height:        Intake/Output Summary (Last 24 hours) at 06/18/2019 2055 Last data filed at 06/18/2019 1057 Gross per 24 hour  Intake 1163.57 ml  Output -  Net 1163.57 ml   Filed Weights   06/17/19 2106  Weight: 81.6 kg    Examination:   Constitutional: NAD, not oriented, speech unintelligible, repetitive sounds HEENT: conjunctivae and lids normal, EOMI CV: RRR no M,R,G. Distal pulses +2.  No cyanosis.   RESP: CTA B/L over anterior, normal respiratory effort, on 4L GI: +BS, NTND Extremities: No effusions, edema, or tenderness in BLE SKIN: warm, dry and intact Neuro: II - XII grossly intact.     Data Reviewed: I have personally reviewed following labs and imaging studies  CBC: Recent Labs  Lab 06/17/19 1631  06/18/19 0513  WBC 14.2*  13.9* 10.3  NEUTROABS 11.1*  ORDER MODIFIED OR RESCHEDULED 9.6*  HGB 6.5*  6.4* 7.8*  HCT 23.6*  24.1* 26.6*  MCV 69.0*  68.3* 71.5*  PLT 520*  520* 359   Basic Metabolic Panel: Recent Labs  Lab 06/17/19 1631 06/18/19 0513  NA 143 143  K 4.3 4.1  CL 111 115*  CO2 19* 15*  GLUCOSE 195* 297*  BUN  36* 37*  CREATININE 1.38* 1.18  CALCIUM 8.6* 7.7*  MG  --  2.3  PHOS  --  3.5   GFR: Estimated Creatinine Clearance: 31.8 mL/min (by C-G formula based on SCr of 1.18 mg/dL). Liver Function Tests: Recent Labs  Lab 06/17/19 1631 06/18/19 0513  AST 34 21  ALT 25 22  ALKPHOS 114 98  BILITOT 0.9 0.7  PROT 7.4 6.8  ALBUMIN 2.4* 2.1*   No results for input(s): LIPASE, AMYLASE in the last 168 hours. No results for input(s): AMMONIA in the last 168 hours. Coagulation Profile: No results for input(s): INR, PROTIME in the last 168 hours. Cardiac Enzymes: No results for input(s): CKTOTAL, CKMB, CKMBINDEX, TROPONINI in the last 168 hours. BNP (last 3 results) No results for input(s): PROBNP in the last 8760 hours. HbA1C: No results for input(s): HGBA1C in the last 72 hours. CBG: Recent Labs  Lab 06/18/19 1015  GLUCAP 306*   Lipid Profile: No results for input(s): CHOL, HDL, LDLCALC, TRIG, CHOLHDL, LDLDIRECT in the last 72 hours. Thyroid Function Tests: No results for input(s): TSH, T4TOTAL, FREET4, T3FREE, THYROIDAB in the last 72 hours. Anemia Panel: Recent Labs    06/17/19 1631 06/18/19 0513  FERRITIN 20* 23*   Sepsis Labs: Recent Labs  Lab 06/17/19 1631 06/17/19 2109 06/18/19 0119  PROCALCITON 0.74  --   --   LATICACIDVEN 2.8* 6.9* 2.5*    Recent Results (from the past 240 hour(s))  Blood Culture (routine x 2)     Status: None (Preliminary result)   Collection Time: 06/17/19  4:31 PM   Specimen: BLOOD  Result Value Ref Range Status   Specimen Description BLOOD BLOOD RIGHT HAND  Final   Special Requests   Final    BOTTLES DRAWN AEROBIC AND ANAEROBIC Blood Culture adequate volume   Culture   Final    NO GROWTH < 24 HOURS Performed at [facility], [address], [place], [place] [address]    Report Status PENDING  Incomplete  Blood Culture (routine x 2)     Status: None (Preliminary result)   Collection Time: 06/17/19  4:31 PM   Specimen: BLOOD   Result Value Ref Range Status   Specimen Description BLOOD BLOOD LEFT WRIST  Final   Special Requests   Final    BOTTLES DRAWN AEROBIC AND ANAEROBIC Blood Culture adequate volume   Culture   Final    NO GROWTH < 24 HOURS Performed at [facility], [address] [place], [place] [address]    Report Status PENDING  Incomplete  Respiratory Panel by RT PCR (Flu A&B, Covid) - Nasopharyngeal Swab     Status: Abnormal   Collection Time: 06/17/19  7:00 PM   Specimen: Nasopharyngeal Swab  Result Value Ref Range Status   SARS Coronavirus 2 by RT PCR POSITIVE (A) NEGATIVE Final    Comment: RESULT CALLED TO, READ BACK BY AND VERIFIED WITH: LEIGH FERGUSON ON 06/17/19 AT [address] [facility] (NOTE) SARS-CoV-2 target nucleic acids are DETECTED. SARS-CoV-2 RNA is generally detectable in upper  respiratory specimens  during the acute phase of infection. Positive results are indicative of the presence of the identified virus, but do not rule out bacterial infection or co-infection with other pathogens not detected by the test. Clinical correlation with patient history and other diagnostic information is necessary to determine patient infection status. The expected result is Negative. Fact Sheet for Patients:  https://www.moore.com/ Fact Sheet for Healthcare Providers: https://www.young.biz/ This test is not yet approved or cleared by the [place] FDA and  has been authorized for detection and/or diagnosis of SARS-CoV-2 by FDA under an Emergency Use Authorization (EUA).  This EUA will remain in effect (meaning this test can be used)  for the duration of  the COVID-19 declaration under Section 564(b)(1) of the Act, 21 U.S.C. section 360bbb-3(b)(1), unless the authorization is terminated or revoked sooner.    Influenza A by PCR NEGATIVE NEGATIVE Final   Influenza B by PCR NEGATIVE NEGATIVE Final    Comment: (NOTE) The Xpert Xpress SARS-CoV-2/FLU/RSV assay  is intended as an aid in  the diagnosis of influenza from Nasopharyngeal swab specimens and  should not be used as a sole basis for treatment. Nasal washings and  aspirates are unacceptable for Xpert Xpress SARS-CoV-2/FLU/RSV  testing. Fact Sheet for Patients: https://www.moore.com/ Fact Sheet for Healthcare Providers: https://www.young.biz/ This test is not yet approved or cleared by the [place] FDA and  has been authorized for detection and/or diagnosis of SARS-CoV-2 by  FDA under an Emergency Use Authorization (EUA). This EUA will remain  in effect (meaning this test can be used) for the duration of the  Covid-19 declaration under Section 564(b)(1) of the Act, 21  U.S.C. section 360bbb-3(b)(1), unless the authorization is  terminated or revoked. Performed at [facility], [address], [place], [place] [address]       Radiology Studies: DG Chest [place] 1 View  Result Date: 06/17/2019 CLINICAL DATA:  Fever, altered mental status EXAM: PORTABLE CHEST 1 VIEW COMPARISON:  04/13/2016 FINDINGS: Patchy bilateral airspace opacities are noted, left greater than right. Heart is borderline in size. No effusions or pneumothorax. No acute bony abnormality. IMPRESSION: Patchy bilateral airspace disease, left greater than right concerning for pneumonia. Electronically Signed   By: Charlett Nose M.D.   On: 06/17/2019 17:09     Scheduled Meds: . vitamin C  500 mg Oral Daily  . dexamethasone (DECADRON) injection  6 mg Intravenous Q24H  . insulin aspart  0-9 Units Subcutaneous TID WC  . Ipratropium-Albuterol  1 puff Inhalation Q6H  . pantoprazole  40 mg Intravenous Q12H  . pravastatin  40 mg Oral QHS  . sodium chloride flush  3 mL Intravenous Q12H  . zinc sulfate  220 mg Oral Daily   Continuous Infusions: . remdesivir 100 mg in NS 100 mL Stopped (06/18/19 1057)     LOS: 1 day     Darlin Priestly, MD [facility] If 7PM-7AM, please  contact night-coverage 06/18/2019, 8:55 PM

## 2019-06-18 NOTE — ED Notes (Signed)
Dr. Lai at bedside

## 2019-06-18 NOTE — ED Notes (Signed)
Report given to Trish, RN

## 2019-06-18 NOTE — Sepsis Progress Note (Signed)
Notified provider of need to order repeat lactic acid since the 2nd LA is elevated.

## 2019-06-18 NOTE — ED Notes (Addendum)
Pt brief and linens changed by this RN and Jae Dire, RN- condom cath placed on pt- sacral pad noted on pt's bottom to be clean, dry, and intact- meal tray and water offered, but pt unable to put lips around straw

## 2019-06-18 NOTE — ED Notes (Signed)
Per floor, okay to go ahead and take up pt

## 2019-06-18 NOTE — Plan of Care (Signed)
Patient education will be difficult considering patient's baseline mental status.

## 2019-06-19 ENCOUNTER — Other Ambulatory Visit: Payer: Self-pay

## 2019-06-19 LAB — COMPREHENSIVE METABOLIC PANEL
ALT: 21 U/L (ref 0–44)
AST: 17 U/L (ref 15–41)
Albumin: 2.2 g/dL — ABNORMAL LOW (ref 3.5–5.0)
Alkaline Phosphatase: 105 U/L (ref 38–126)
Anion gap: 13 (ref 5–15)
BUN: 43 mg/dL — ABNORMAL HIGH (ref 8–23)
CO2: 15 mmol/L — ABNORMAL LOW (ref 22–32)
Calcium: 8.6 mg/dL — ABNORMAL LOW (ref 8.9–10.3)
Chloride: 118 mmol/L — ABNORMAL HIGH (ref 98–111)
Creatinine, Ser: 1.09 mg/dL (ref 0.61–1.24)
GFR calc Af Amer: >60 mL/min (ref >60)
GFR calc non Af Amer: 54 mL/min — ABNORMAL LOW (ref >60)
Glucose, Bld: 274 mg/dL — ABNORMAL HIGH (ref 70–99)
Potassium: 4.5 mmol/L (ref 3.5–5.1)
Sodium: 146 mmol/L — ABNORMAL HIGH (ref 135–145)
Total Bilirubin: 1.1 mg/dL (ref 0.3–1.2)
Total Protein: 7.4 g/dL (ref 6.5–8.1)

## 2019-06-19 LAB — CBC WITH DIFFERENTIAL/PLATELET
Abs Immature Granulocytes: 0.09 10*3/uL — ABNORMAL HIGH (ref 0.00–0.07)
Basophils Absolute: 0 10*3/uL (ref 0.0–0.1)
Basophils Relative: 0 %
Eosinophils Absolute: 0 10*3/uL (ref 0.0–0.5)
Eosinophils Relative: 0 %
HCT: 32.6 % — ABNORMAL LOW (ref 39.0–52.0)
Hemoglobin: 9.7 g/dL — ABNORMAL LOW (ref 13.0–17.0)
Immature Granulocytes: 1 %
Lymphocytes Relative: 6 %
Lymphs Abs: 0.6 10*3/uL — ABNORMAL LOW (ref 0.7–4.0)
MCH: 20.6 pg — ABNORMAL LOW (ref 26.0–34.0)
MCHC: 29.8 g/dL — ABNORMAL LOW (ref 30.0–36.0)
MCV: 69.4 fL — ABNORMAL LOW (ref 80.0–100.0)
Monocytes Absolute: 0.2 10*3/uL (ref 0.1–1.0)
Monocytes Relative: 2 %
Neutro Abs: 9.6 10*3/uL — ABNORMAL HIGH (ref 1.7–7.7)
Neutrophils Relative %: 91 %
Platelets: 520 10*3/uL — ABNORMAL HIGH (ref 150–400)
RBC: 4.7 MIL/uL (ref 4.22–5.81)
RDW: 20 % — ABNORMAL HIGH (ref 11.5–15.5)
WBC: 10.5 10*3/uL (ref 4.0–10.5)
nRBC: 0.6 % — ABNORMAL HIGH (ref 0.0–0.2)

## 2019-06-19 LAB — URINE CULTURE: Culture: NO GROWTH

## 2019-06-19 LAB — GLUCOSE, CAPILLARY
Glucose-Capillary: 262 mg/dL — ABNORMAL HIGH (ref 70–99)
Glucose-Capillary: 251 mg/dL — ABNORMAL HIGH (ref 70–99)
Glucose-Capillary: 211 mg/dL — ABNORMAL HIGH (ref 70–99)
Glucose-Capillary: 207 mg/dL — ABNORMAL HIGH (ref 70–99)
Glucose-Capillary: 247 mg/dL — ABNORMAL HIGH (ref 70–99)

## 2019-06-19 LAB — MAGNESIUM: Magnesium: 2.5 mg/dL — ABNORMAL HIGH (ref 1.7–2.4)

## 2019-06-19 LAB — FIBRIN DERIVATIVES D-DIMER (ARMC ONLY): Fibrin derivatives D-dimer (ARMC): 4711.83 ng/mL (FEU) — ABNORMAL HIGH (ref 0.00–499.00)

## 2019-06-19 LAB — C-REACTIVE PROTEIN: CRP: 20.2 mg/dL — ABNORMAL HIGH (ref <1.0)

## 2019-06-19 LAB — LACTIC ACID, PLASMA: Lactic Acid, Venous: 2.5 mmol/L (ref 0.5–1.9)

## 2019-06-19 MED ORDER — DEXAMETHASONE SODIUM PHOSPHATE 10 MG/ML IJ SOLN
6.0000 mg | INTRAMUSCULAR | Status: DC
  Administered 2019-06-19 – 2019-06-21 (×3): 6 mg via INTRAVENOUS
  Filled 2019-06-19 (×3): qty 1

## 2019-06-19 MED ORDER — ADULT MULTIVITAMIN W/MINERALS CH
1.0000 | ORAL_TABLET | Freq: Every day | ORAL | Status: DC
  Administered 2019-06-20: 1 via ORAL
  Filled 2019-06-19: qty 1

## 2019-06-19 MED ORDER — INSULIN GLARGINE 100 UNIT/ML ~~LOC~~ SOLN
8.0000 [IU] | Freq: Every day | SUBCUTANEOUS | Status: DC
  Administered 2019-06-19 – 2019-06-21 (×3): 8 [IU] via SUBCUTANEOUS
  Filled 2019-06-19 (×4): qty 0.08

## 2019-06-19 MED ORDER — METHYLPREDNISOLONE SODIUM SUCC 40 MG IJ SOLR: 40.0000 mg | Freq: Three times a day (TID) | INTRAMUSCULAR | Status: DC

## 2019-06-19 MED ORDER — INSULIN ASPART 100 UNIT/ML ~~LOC~~ SOLN
0.0000 [IU] | Freq: Three times a day (TID) | SUBCUTANEOUS | Status: DC
  Administered 2019-06-19 – 2019-06-20 (×3): 5 [IU] via SUBCUTANEOUS
  Administered 2019-06-20: 3 [IU] via SUBCUTANEOUS
  Administered 2019-06-21 (×2): 2 [IU] via SUBCUTANEOUS
  Administered 2019-06-21: 08:00:00 5 [IU] via SUBCUTANEOUS
  Filled 2019-06-19 (×7): qty 1

## 2019-06-19 MED ORDER — NEPRO/CARBSTEADY PO LIQD
237.0000 mL | Freq: Three times a day (TID) | ORAL | Status: DC
  Administered 2019-06-19 – 2019-06-20 (×3): 237 mL via ORAL

## 2019-06-19 NOTE — Evaluation (Signed)
Clinical/Bedside Swallow Evaluation Patient Details  Name: Frank Delgado MRN: [id number] Date of Birth: 1913/11/12  Today's Date: 06/19/2019 Time: SLP Start Time (ACUTE ONLY): 90 SLP Stop Time (ACUTE ONLY): 1510 SLP Time Calculation (min) (ACUTE ONLY): 55 min  Past Medical History:  Past Medical History:  Diagnosis Date  . CHF (congestive heart failure) ([place])   . COVID-19   . Diabetes mellitus without complication ([place])   . Hypertension    Past Surgical History:  Past Surgical History:  Procedure Laterality Date  . HIP SURGERY    . STOMACH SURGERY     HPI:  Pt is a 86 y.o. male with medical history significant for CHF, type 2 diabetes, hypertension, hyperlipidemia, and iron deficiency anemia who presents to the ED for evaluation of fevers, dyspnea, and altered mental status.  About 2 weeks ago he was diagnosed with a UTI and started on antibiotic therapy.  He has a home health nurse who comes when needed and there is a question of having a left-sided pneumonia and therefore his antibiotic regimen was adjusted.  During this time span he has been having increased weakness and lethargy and has not really been walking.  He has had decreased oral intake.  2 days ago his grandson called paramedics for further evaluation and he had reportedly good vitals and oxygen saturation.  EMS were again called today and he was noted to have hypoxia and he was given a breathing treatment and brought to the ED for further evaluation.  Per the ED notes taken, pt has had "progressive decline x several weeks, confusion x several days".  Per EMS report, patient lives at home with family.  Over the last 2 weeks, he has had a significant, progressive decline, including generalized weakness, confusion, disorientation.  CXR: "Patchy bilateral airspace disease, left greater than right"; pt is Covic19 positive.  Head CT in 2017: "cortical Atrophy and Chronic microvascular ischemic change" then.    Assessment / Plan /  Recommendation Clinical Impression  Pt presents w/ oropharyngeal phase dyspahgia in the setting of Severe Cognitive decline impacting his overall awareness and attention to po/oral tasks. Pt required full positioning upright in bed for po's. He did not help in his positioning. SLP attempted oral care and exam, but pt often shut his mouth tightly and turned his head away, moving about in bed in an agitated manner. Pt given time to calm again b/f presenting po trials. Pt often exhibted Jargon, garbled speech but in between the talking, po trials of ice cream and Nectar liquids were presented anteriorly in his mouth(behind front teeth). Pt exhibited Oral phase deficits c/b decreased attention/awareness to task w/ reduced labial closure on spoon, oral holding, and delayed A-P transit for swallowing. Pt also exhibited spitting out of boluses(SLP did not encourage further once this behavior began). No overt clinical s/s of aspiration were noted w/ few trials swallowed; no overt coughing or decline in respiratory effort. Vocal quality remained clear b/t trials. Pt required full feeding support and encouragement.  Recommend a Dysphagia level 1 (puree) diet w/ Nectar liquids by TSP; aspiration precautions; feeding support but to stop if too agitated or s/s of aspiration noted. Pills Crushed in Puree for safer swallowing. ST services will continue to follow while admitted. MD/NSG updated.  SLP Visit Diagnosis: Dysphagia, oropharyngeal phase (R13.12)(d/t apparent Cognitive decline)    Aspiration Risk  Mild aspiration risk;Moderate aspiration risk;Risk for inadequate nutrition/hydration    Diet Recommendation  Dysphagia level 1 (puree) w/ Nectar consistency liquids; aspiration  precautions; full feeding support and monitoring.  Medication Administration: Crushed with puree(for safer swallowing)    Other  Recommendations Recommended Consults: (Palliative Care consult; Dietician f/u) Oral Care Recommendations: Oral  care BID;Oral care before and after PO;Staff/trained caregiver to provide oral care Other Recommendations: Order thickener from pharmacy;Prohibited food (jello, ice cream, thin soups);Remove water pitcher;Have oral suction available   Follow up Recommendations (TBD)      Frequency and Duration min 2x/week  1 week       Prognosis Prognosis for Safe Diet Advancement: Guarded Barriers to Reach Goals: Cognitive deficits;Time post onset;Severity of deficits      Swallow Study   General Date of Onset: 06/17/19 HPI: Pt is a 86 y.o. male with medical history significant for CHF, type 2 diabetes, hypertension, hyperlipidemia, and iron deficiency anemia who presents to the ED for evaluation of fevers, dyspnea, and altered mental status.  About 2 weeks ago he was diagnosed with a UTI and started on antibiotic therapy.  He has a home health nurse who comes when needed and there is a question of having a left-sided pneumonia and therefore his antibiotic regimen was adjusted.  During this time span he has been having increased weakness and lethargy and has not really been walking.  He has had decreased oral intake.  2 days ago his grandson called paramedics for further evaluation and he had reportedly good vitals and oxygen saturation.  EMS were again called today and he was noted to have hypoxia and he was given a breathing treatment and brought to the ED for further evaluation.  Per the ED notes taken, pt has had "progressive decline x several weeks, confusion x several days".  Per EMS report, patient lives at home with family.  Over the last 2 weeks, he has had a significant, progressive decline, including generalized weakness, confusion, disorientation.  CXR: "Patchy bilateral airspace disease, left greater than right"; pt is Covic19 positive.  Head CT in 2017: "cortical Atrophy and Chronic microvascular ischemic change" then.  Type of Study: Bedside Swallow Evaluation Previous Swallow Assessment: 2017 -  mech soft diet, thin liquids then Diet Prior to this Study: Dysphagia 3 (soft);Thin liquids(last eval) Temperature Spikes Noted: No(wbc 10.5) Respiratory Status: Room air History of Recent Intubation: No Behavior/Cognition: Confused;Agitated;Uncooperative;Distractible;Doesn't follow directions;Requires cueing(garbled speech; awake) Oral Cavity Assessment: (difficult to determine) Oral Care Completed by SLP: Yes(missing Dentition) Oral Cavity - Dentition: Poor condition;Missing dentition Vision: (n/a) Self-Feeding Abilities: Total assist Patient Positioning: Upright in bed(needed full positioning) Baseline Vocal Quality: Normal(garbled speech) Volitional Cough: Cognitively unable to elicit Volitional Swallow: Unable to elicit    Oral/Motor/Sensory Function Overall Oral Motor/Sensory Function: (no unilateral weakness noted)   Ice Chips Ice chips: Not tested   Thin Liquid Thin Liquid: Not tested    Nectar Thick Nectar Thick Liquid: Impaired Presentation: Spoon(fed; 6 trials attempted) Oral Phase Impairments: Reduced labial seal;Reduced lingual movement/coordination;Poor awareness of bolus Oral phase functional implications: Prolonged oral transit;Oral holding Pharyngeal Phase Impairments: (none noted) Other Comments: pt spit out 1 trial bolus   Honey Thick Honey Thick Liquid: Not tested   Puree Puree: Impaired Presentation: Spoon(fed; 4 trials attempted) Oral Phase Impairments: Reduced labial seal;Reduced lingual movement/coordination;Poor awareness of bolus Oral Phase Functional Implications: Prolonged oral transit;Oral holding Pharyngeal Phase Impairments: (none noted) Other Comments: pt spit out 1 trial   Solid     Solid: Not tested       Jerilynn Som, MS, CCC-SLP [facility] Modisette 06/19/2019,3:16 PM

## 2019-06-19 NOTE — Progress Notes (Signed)
Initial Nutrition Assessment  DOCUMENTATION CODES:   Not applicable  INTERVENTION:   Nepro Shake po TID, each supplement provides 425 kcal and 19 grams protein  Magic cup TID with meals, each supplement provides 290 kcal and 9 grams of protein  MVI daily   Pt likely at moderate refeed risk   NUTRITION DIAGNOSIS:   Increased nutrient needs related to catabolic illness(COVID 19) as evidenced by increased estimated needs.  GOAL:   Patient will meet greater than or equal to 90% of their needs  MONITOR:   PO intake, Supplement acceptance, Labs, Weight trends, Skin, I & O's  REASON FOR ASSESSMENT:   Malnutrition Screening Tool    ASSESSMENT:   86 y.o. male with medical history significant for CHF, type 2 diabetes, hypertension, hyperlipidemia, and iron deficiency anemia who presents to the ED for evaluation of fevers, dyspnea, and altered mental status and found to have COVID 19   Unable to speak with pt r/t AMS. Suspect pt with poor appetite and oral intake pta r/t COVID 19. Pt seen by SLP today and placed on a dysphagia 1/nectar thick diet. RD will add supplements and vitamins to help pt meet his estimated needs. There is no recent weight history in chart to determine if any significant weight loss pta.    Pt at high risk for malnutrition but unable to diagnose at this time as NFPE cannot be performed.   Medications reviewed and include: vitamin C, dexamethasone, insulin, protonix, zinc  Labs reviewed: Na 146(H), BUN 43(H), P 3.5 wnl, Mg 2.5(H) Hgb 9.7(L), Hct 32.6(L), MCV 69.4(L), MCH 20.6(L), MCHC 29.8(L) Ferritin 23(L)- 1/10 cbgs- 306, 262, 251, 211 x 24 hrs  Unable to complete Nutrition-Focused physical exam at this time as pt with COVID 19.   Diet Order:   Diet Order            DIET - DYS 1 Room service appropriate? Yes with Assist; Fluid consistency: Nectar Thick  Diet effective now             EDUCATION NEEDS:   Not appropriate for education at this  time  Skin:  Skin Assessment: Reviewed RN Assessment(Stage II sacrum)  Last BM:  1/10  Height:   Ht Readings from Last 1 Encounters:  06/17/19 5\' 10"  (1.778 m)    Weight:   Wt Readings from Last 1 Encounters:  06/17/19 81.6 kg    Ideal Body Weight:  75.4 kg  BMI:  Body mass index is 25.83 kg/m.  Estimated Nutritional Needs:   Kcal:  2000-2300kcal/day  Protein:  100-115g/day  Fluid:  >1.9L/day  08/15/19 MS, RD, LDN Pager #- [phone] Office#- [phone] After Hours Pager: [phone]

## 2019-06-19 NOTE — Plan of Care (Signed)
Patient continues to be confused however is not impulsive.  -No evidence of learning. -Unable to move ambulate at this time. Patient is very guarded with any movement.  -Speech therapy consulted for swallowing. Dysphagia 1 diet ordered with nectar thick liquids, supplements ordered between meals. Needs total assist with feeding.   Problem: Education: Goal: Knowledge of General Education information will improve Description: Including pain rating scale, medication(s)/side effects and non-pharmacologic comfort measures Outcome: Not Progressing   Problem: Activity: Goal: Risk for activity intolerance will decrease Outcome: Not Progressing   Problem: Nutrition: Goal: Adequate nutrition will be maintained Outcome: Not Progressing   Problem: Elimination: Goal: Will not experience complications related to urinary retention Outcome: Progressing   Problem: Safety: Goal: Ability to remain free from injury will improve Outcome: Progressing

## 2019-06-19 NOTE — Progress Notes (Signed)
PROGRESS NOTE    Frank Delgado  IWP:[id number] DOB: April 20, 1914 DOA: 06/17/2019 PCP: Corky Downs, MD    Assessment & Plan:   Principal Problem:   Acute hypoxemic respiratory failure due to COVID-19 [facility]) Active Problems:   AKI (acute kidney injury) (HCC)   Iron deficiency anemia due to chronic blood loss   CHF (congestive heart failure) (HCC)   Diabetes mellitus without complication (HCC)   Hyperlipidemia associated with type 2 diabetes mellitus (HCC)   Pressure injury of skin    Frank Delgado is a 86 y.o. Caucasian male with medical history significant for CHF, type 2 diabetes, hypertension, hyperlipidemia, and iron deficiency anemia who presents to the ED for evaluation of fevers, dyspnea, and altered mental status.   Acute hypoxic respiratory failure due to COVID-19 viral pneumonia: SARS-CoV-2 PCR positive 06/17/2019.  He was requiring 4 L supplemental O2 via [place] on presentation.  Chest x-ray shows bilateral patchy airspace opacities. --Already on RA today PLAN: -Continue IV remdesivir per pharmacy protocol -Continue IV dexamethasone 6 mg daily -Continue I-S, flutter valve, Combivent -Antitussives, vitamin C, zinc  Anemia likely due to chronic blood loss and iron deficiency: Hemoglobin 6.4 on arrival.  Black stool which was guaiac positive per EDP. -s/p 1 unit PRBC with appropriate rise in Hgb -Protonix 40 mg IV twice daily -Hold home aspirin and pharmacologic VTE prophylaxis  Acute kidney injury, improved Likely prerenal from dehydration in setting of COVID-19 infection and anemia.  S/p IV fluid hydration. -Holding home Lasix  Lactic acidosis, improved Significantly volume depleted.  S/p IV fluid hydration and transfusion of 1 unit PRBC as above.  Elevated troponin: Mildly elevated, likely demand ischemia from above the above.  Acute metabolic encephalopathy: Secondary to hypoxic respiratory failure.   Continue management as above.  Type 2 diabetes: Holding  home Amaryl.  Continue sensitive SSI while in hospital.  Hyperlipidemia: Continue pravastatin.  Dysphagia --Pt was found to refuse PO intake and pocketing food. --Dysphagia diet, per SLP rec  Goals of care: Admitting physician discussed with patient's grandson Frank Delgado by phone, [phone].  Grandson at this time wants to continue with full scope of care including full CODE STATUS in the event of cardiac/respiratory arrest.  He wants his grandfather to return home to him by Wednesday.   DVT prophylaxis: SCD/Compression stockings Code Status: Full code  Disposition Plan: Home, per grandson   Subjective and Interval History:  ROS can't be obtained due to pt's dementia.  Pt only grunts and moans.  Pt was found to pocket foods today, so SLP consulted who put pt on dysphagia diet.   Objective: Vitals:   06/19/19 0700 06/19/19 1015 06/19/19 1254 06/19/19 1643  BP: 139/81 122/67  (!) 166/66  Pulse: 74 79  68  Resp: (!) 22 15  17   Temp: (!) 96.3 F (35.7 C)  97.8 F (36.6 C)   TempSrc: Axillary  Rectal   SpO2: 100%   100%  Weight:      Height:        Intake/Output Summary (Last 24 hours) at 06/19/2019 1712 Last data filed at 06/19/2019 1500 Gross per 24 hour  Intake 100 ml  Output 300 ml  Net -200 ml   Filed Weights   06/17/19 2106  Weight: 81.6 kg    Examination:   Constitutional: NAD, not oriented, speech unintelligible, only grunts and moans HEENT: conjunctivae and lids normal, EOMI CV: RRR no M,R,G. Distal pulses +2.  No cyanosis.   RESP: CTA B/L over anterior, normal  respiratory effort, on RA GI: +BS, NTND Extremities: No effusions, edema, or tenderness in BLE SKIN: warm, dry and intact Neuro: II - XII grossly intact.     Data Reviewed: I have personally reviewed following labs and imaging studies  CBC: Recent Labs  Lab 06/17/19 1631 06/18/19 0513 06/19/19 0843  WBC 14.2*  13.9* 10.3 10.5  NEUTROABS 11.1*  ORDER MODIFIED OR RESCHEDULED 9.6*  9.6*  HGB 6.5*  6.4* 7.8* 9.7*  HCT 23.6*  24.1* 26.6* 32.6*  MCV 69.0*  68.3* 71.5* 69.4*  PLT 520*  520* 359 520*   Basic Metabolic Panel: Recent Labs  Lab 06/17/19 1631 06/18/19 0513 06/19/19 0843  NA 143 143 146*  K 4.3 4.1 4.5  CL 111 115* 118*  CO2 19* 15* 15*  GLUCOSE 195* 297* 274*  BUN 36* 37* 43*  CREATININE 1.38* 1.18 1.09  CALCIUM 8.6* 7.7* 8.6*  MG  --  2.3 2.5*  PHOS  --  3.5  --    GFR: Estimated Creatinine Clearance: 34.4 mL/min (by C-G formula based on SCr of 1.09 mg/dL). Liver Function Tests: Recent Labs  Lab 06/17/19 1631 06/18/19 0513 06/19/19 0843  AST 34 21 17  ALT 25 22 21   ALKPHOS 114 98 105  BILITOT 0.9 0.7 1.1  PROT 7.4 6.8 7.4  ALBUMIN 2.4* 2.1* 2.2*   No results for input(s): LIPASE, AMYLASE in the last 168 hours. No results for input(s): AMMONIA in the last 168 hours. Coagulation Profile: No results for input(s): INR, PROTIME in the last 168 hours. Cardiac Enzymes: No results for input(s): CKTOTAL, CKMB, CKMBINDEX, TROPONINI in the last 168 hours. BNP (last 3 results) No results for input(s): PROBNP in the last 8760 hours. HbA1C: No results for input(s): HGBA1C in the last 72 hours. CBG: Recent Labs  Lab 06/18/19 1015 06/19/19 0809 06/19/19 1109 06/19/19 1540 06/19/19 1653  GLUCAP 306* 262* 251* 211* 207*   Lipid Profile: No results for input(s): CHOL, HDL, LDLCALC, TRIG, CHOLHDL, LDLDIRECT in the last 72 hours. Thyroid Function Tests: No results for input(s): TSH, T4TOTAL, FREET4, T3FREE, THYROIDAB in the last 72 hours. Anemia Panel: Recent Labs    06/17/19 1631 06/18/19 0513  FERRITIN 20* 23*   Sepsis Labs: Recent Labs  Lab 06/17/19 1631 06/17/19 2109 06/18/19 0119 06/19/19 1531  PROCALCITON 0.74  --   --   --   LATICACIDVEN 2.8* 6.9* 2.5* 2.5*    Recent Results (from the past 240 hour(s))  Blood Culture (routine x 2)     Status: None (Preliminary result)   Collection Time: 06/17/19  4:31 PM    Specimen: BLOOD  Result Value Ref Range Status   Specimen Description BLOOD BLOOD RIGHT HAND  Final   Special Requests   Final    BOTTLES DRAWN AEROBIC AND ANAEROBIC Blood Culture adequate volume   Culture   Final    NO GROWTH 2 DAYS Performed at [facility], [address] [place], [place]    Report Status PENDING  Incomplete  Blood Culture (routine x 2)     Status: None (Preliminary result)   Collection Time: 06/17/19  4:31 PM   Specimen: BLOOD  Result Value Ref Range Status   Specimen Description BLOOD BLOOD LEFT WRIST  Final   Special Requests   Final    BOTTLES DRAWN AEROBIC AND ANAEROBIC Blood Culture adequate volume   Culture   Final    NO GROWTH 2 DAYS Performed at [facility], [address] [place], [place] [address]    Report Status PENDING  Incomplete  Urine culture     Status: None   Collection Time: 06/17/19  5:19 PM   Specimen: In/Out Cath Urine  Result Value Ref Range Status   Specimen Description   Final    IN/OUT CATH URINE Performed at [facility], [address] [place], [place] [address]    Special Requests   Final    NONE Performed at [facility], [address], [place], [place] [address]    Culture   Final    NO GROWTH Performed at [facility], [address], [place], [place] [address]    Report Status 06/19/2019 FINAL  Final  Respiratory Panel by RT PCR (Flu A&B, Covid) - Nasopharyngeal Swab     Status: Abnormal   Collection Time: 06/17/19  7:00 PM   Specimen: Nasopharyngeal Swab  Result Value Ref Range Status   SARS Coronavirus 2 by RT PCR POSITIVE (A) NEGATIVE Final    Comment: RESULT CALLED TO, READ BACK BY AND VERIFIED WITH: LEIGH FERGUSON ON 06/17/19 AT [facility] (NOTE) SARS-CoV-2 target nucleic acids are DETECTED. SARS-CoV-2 RNA is generally detectable in upper respiratory specimens  during the acute phase of infection. Positive results are indicative of the presence of the  identified virus, but do not rule out bacterial infection or co-infection with other pathogens not detected by the test. Clinical correlation with patient history and other diagnostic information is necessary to determine patient infection status. The expected result is Negative. Fact Sheet for Patients:  https://www.moore.com/ Fact Sheet for Healthcare Providers: https://www.young.biz/ This test is not yet approved or cleared by the [place] FDA and  has been authorized for detection and/or diagnosis of SARS-CoV-2 by FDA under an Emergency Use Authorization (EUA).  This EUA will remain in effect (meaning this test can be used)  for the duration of  the COVID-19 declaration under Section 564(b)(1) of the Act, 21 U.S.C. section 360bbb-3(b)(1), unless the authorization is terminated or revoked sooner.    Influenza A by PCR NEGATIVE NEGATIVE Final   Influenza B by PCR NEGATIVE NEGATIVE Final    Comment: (NOTE) The Xpert Xpress SARS-CoV-2/FLU/RSV assay is intended as an aid in  the diagnosis of influenza from Nasopharyngeal swab specimens and  should not be used as a sole basis for treatment. Nasal washings and  aspirates are unacceptable for Xpert Xpress SARS-CoV-2/FLU/RSV  testing. Fact Sheet for Patients: https://www.moore.com/ Fact Sheet for Healthcare Providers: https://www.young.biz/ This test is not yet approved or cleared by the [place] FDA and  has been authorized for detection and/or diagnosis of SARS-CoV-2 by  FDA under an Emergency Use Authorization (EUA). This EUA will remain  in effect (meaning this test can be used) for the duration of the  Covid-19 declaration under Section 564(b)(1) of the Act, 21  U.S.C. section 360bbb-3(b)(1), unless the authorization is  terminated or revoked. Performed at [facility], [address], [place], [place] [address]       Radiology  Studies: No results found.   Scheduled Meds: . vitamin C  500 mg Oral Daily  . dexamethasone (DECADRON) injection  6 mg Intravenous Q24H  . feeding supplement (NEPRO CARB STEADY)  237 mL Oral TID BM  . influenza vaccine adjuvanted  0.5 mL Intramuscular Tomorrow-1000  . insulin aspart  0-15 Units Subcutaneous TID WC  . insulin glargine  8 Units Subcutaneous Daily  . Ipratropium-Albuterol  1 puff Inhalation Q6H  . [  START ON 06/20/2019] multivitamin with minerals  1 tablet Oral Daily  . pantoprazole  40 mg Intravenous Q12H  . pneumococcal 23 valent vaccine  0.5 mL Intramuscular Tomorrow-1000  . pravastatin  40 mg Oral QHS  . sodium chloride flush  3 mL Intravenous Q12H  . zinc sulfate  220 mg Oral Daily   Continuous Infusions: . remdesivir 100 mg in NS 100 mL 100 mg (06/19/19 0829)     LOS: 2 days     Enzo Bi, MD [facility] If 7PM-7AM, please contact night-coverage 06/19/2019, 5:12 PM

## 2019-06-19 NOTE — Progress Notes (Signed)
Inpatient Diabetes Program Recommendations  AACE/ADA: New Consensus Statement on Inpatient Glycemic Control (2015)  Target Ranges:  Prepandial:   less than 140 mg/dL      Peak postprandial:   less than 180 mg/dL (1-2 hours)      Critically ill patients:  140 - 180 mg/dL   Results for Frank Delgado, Frank Delgado (MRN [id number]) as of 06/19/2019 14:25  Ref. Range 06/18/2019 10:15 06/19/2019 08:09 06/19/2019 11:09  Glucose-Capillary Latest Ref Range: 70 - 99 mg/dL 767 (H)  7 units NOVOLOG  262 (H)  5 units NOVOLOG  251 (H)  5 units NOVOLOG     Admit with: Acute hypoxemic respiratory failure due to COVID-19 viral pneumonia/ Anemia (Hemoglobin 6.4 on arrival)  History: DM, CHF  Home DM Meds: Amaryl 2mg  Daily       Tresiba 5 units QHS (for CBG >200)  Current Orders: Novolog Sensitive Correction Scale/ SSI (0-9 units) TID AC     Getting Decadron 6 mg Daily.    MD- Please consider:  1. Start Lantus 8 units QHS (0.1 units/kg)  2. Increase Novolog SSI to Moderate scale (0-15 units)     --Will follow patient during hospitalization--  RN, MSN, CDE Diabetes Coordinator Inpatient Glycemic Control Team Team Pager: [phone] (8a-5p)

## 2019-06-20 ENCOUNTER — Other Ambulatory Visit: Payer: Self-pay

## 2019-06-20 LAB — CBC WITH DIFFERENTIAL/PLATELET
Abs Immature Granulocytes: 0.03 10*3/uL (ref 0.00–0.07)
Basophils Absolute: 0 10*3/uL (ref 0.0–0.1)
Basophils Relative: 0 %
Eosinophils Absolute: 0 10*3/uL (ref 0.0–0.5)
Eosinophils Relative: 0 %
HCT: 28.9 % — ABNORMAL LOW (ref 39.0–52.0)
Hemoglobin: 8.3 g/dL — ABNORMAL LOW (ref 13.0–17.0)
Immature Granulocytes: 0 %
Lymphocytes Relative: 6 %
Lymphs Abs: 0.5 10*3/uL — ABNORMAL LOW (ref 0.7–4.0)
MCH: 20.3 pg — ABNORMAL LOW (ref 26.0–34.0)
MCHC: 28.7 g/dL — ABNORMAL LOW (ref 30.0–36.0)
MCV: 70.7 fL — ABNORMAL LOW (ref 80.0–100.0)
Monocytes Absolute: 0.4 10*3/uL (ref 0.1–1.0)
Monocytes Relative: 4 %
Neutro Abs: 8 10*3/uL — ABNORMAL HIGH (ref 1.7–7.7)
Neutrophils Relative %: 90 %
Platelets: 508 10*3/uL — ABNORMAL HIGH (ref 150–400)
RBC: 4.09 MIL/uL — ABNORMAL LOW (ref 4.22–5.81)
RDW: 20.6 % — ABNORMAL HIGH (ref 11.5–15.5)
WBC: 8.9 10*3/uL (ref 4.0–10.5)
nRBC: 0.6 % — ABNORMAL HIGH (ref 0.0–0.2)

## 2019-06-20 LAB — GLUCOSE, CAPILLARY
Glucose-Capillary: 234 mg/dL — ABNORMAL HIGH (ref 70–99)
Glucose-Capillary: 240 mg/dL — ABNORMAL HIGH (ref 70–99)
Glucose-Capillary: 183 mg/dL — ABNORMAL HIGH (ref 70–99)
Glucose-Capillary: 185 mg/dL — ABNORMAL HIGH (ref 70–99)

## 2019-06-20 LAB — COMPREHENSIVE METABOLIC PANEL
ALT: 20 U/L (ref 0–44)
AST: 16 U/L (ref 15–41)
Albumin: 2.1 g/dL — ABNORMAL LOW (ref 3.5–5.0)
Alkaline Phosphatase: 95 U/L (ref 38–126)
Anion gap: 8 (ref 5–15)
BUN: 49 mg/dL — ABNORMAL HIGH (ref 8–23)
CO2: 18 mmol/L — ABNORMAL LOW (ref 22–32)
Calcium: 8.6 mg/dL — ABNORMAL LOW (ref 8.9–10.3)
Chloride: 121 mmol/L — ABNORMAL HIGH (ref 98–111)
Creatinine, Ser: 0.95 mg/dL (ref 0.61–1.24)
GFR calc Af Amer: >60 mL/min (ref >60)
GFR calc non Af Amer: >60 mL/min (ref >60)
Glucose, Bld: 237 mg/dL — ABNORMAL HIGH (ref 70–99)
Potassium: 4.1 mmol/L (ref 3.5–5.1)
Sodium: 147 mmol/L — ABNORMAL HIGH (ref 135–145)
Total Bilirubin: 0.7 mg/dL (ref 0.3–1.2)
Total Protein: 6.4 g/dL — ABNORMAL LOW (ref 6.5–8.1)

## 2019-06-20 LAB — FIBRIN DERIVATIVES D-DIMER (ARMC ONLY): Fibrin derivatives D-dimer (ARMC): 3601.64 ng/mL (FEU) — ABNORMAL HIGH (ref 0.00–499.00)

## 2019-06-20 LAB — MAGNESIUM: Magnesium: 2.6 mg/dL — ABNORMAL HIGH (ref 1.7–2.4)

## 2019-06-20 LAB — PROCALCITONIN: Procalcitonin: 0.55 ng/mL

## 2019-06-20 LAB — PHOSPHORUS: Phosphorus: 2.5 mg/dL (ref 2.5–4.6)

## 2019-06-20 LAB — C-REACTIVE PROTEIN: CRP: 15.2 mg/dL — ABNORMAL HIGH (ref <1.0)

## 2019-06-20 MED ORDER — COLLAGENASE 250 UNIT/GM EX OINT
TOPICAL_OINTMENT | Freq: Every day | CUTANEOUS | Status: DC
  Filled 2019-06-20: qty 30

## 2019-06-20 MED ORDER — ORAL CARE MOUTH RINSE
15.0000 mL | Freq: Two times a day (BID) | OROMUCOSAL | Status: DC
  Administered 2019-06-20 – 2019-06-21 (×2): 15 mL via OROMUCOSAL

## 2019-06-20 MED ORDER — SODIUM CHLORIDE 0.45 % IV SOLN: INTRAVENOUS | Status: AC

## 2019-06-20 NOTE — Care Management Important Message (Signed)
Important Message  Patient Details  Name: Frank Delgado MRN: [id number] Date of Birth: 1913-08-19   Medicare Important Message Given:  Yes Patient is COVID positive in isolation unit.  RNCM gave IM to bedside RN to give to patient.     Allayne Butcher, RN 06/20/2019, 11:20 AM

## 2019-06-20 NOTE — Progress Notes (Signed)
Holding food in mouth, unable to eat lunch refused breakfast, family aware in update

## 2019-06-20 NOTE — Progress Notes (Addendum)
PROGRESS NOTE    Frank Delgado  KYH:[id number] DOB: 06/10/1913 DOA: 06/17/2019 PCP: Corky Downs, MD    Assessment & Plan:   Principal Problem:   Acute hypoxemic respiratory failure due to COVID-19 [facility]) Active Problems:   AKI (acute kidney injury) (HCC)   Iron deficiency anemia due to chronic blood loss   CHF (congestive heart failure) (HCC)   Diabetes mellitus without complication (HCC)   Hyperlipidemia associated with type 2 diabetes mellitus (HCC)   Pressure injury of skin    Frank Delgado is a 86 y.o. Caucasian male with medical history significant for type 2 diabetes, hypertension, hyperlipidemia, and iron deficiency anemia who presents to the ED for evaluation of fevers, dyspnea, and altered mental status.   Acute hypoxic respiratory failure due to COVID-19 viral pneumonia: SARS-CoV-2 PCR positive 06/17/2019.  He was requiring 4 L supplemental O2 via [place] on presentation.  Chest x-ray shows bilateral patchy airspace opacities. --Already on RA by 06/19/19. PLAN: -Continue IV remdesivir (started on 1/9) -Continue IV dexamethasone 6 mg daily -Continue I-S, flutter valve, Combivent -Antitussives, vitamin C, zinc  Anemia likely due to chronic blood loss and iron deficiency: Hemoglobin 6.4 on arrival.  Black stool which was guaiac positive per EDP. -s/p 1 unit PRBC with appropriate rise in Hgb PLAN: -Protonix 40 mg IV twice daily -Hold home aspirin and pharmacologic VTE prophylaxis  Acute kidney injury, resolved Likely prerenal from dehydration in setting of COVID-19 infection and anemia.  S/p IV fluid hydration.  Home Lasix on hold.  PLAN: --Holding home Lasix --Bicarb with sterile water@100  ml/hr for 10 hours  Lactic acidosis, improved Significantly volume depleted.  S/p IV fluid hydration and transfusion of 1 unit PRBC as above.  Elevated troponin: Mildly elevated, likely demand ischemia from above the above.  Acute metabolic encephalopathy Likely underlying  dementia --Reportedly had decline for several weeks prior to presentation.  Mental status has not improved since admission.  Type 2 diabetes Hyperglycemia 2/2 steroids Holding home Amaryl.   Continue sensitive SSI while in hospital.  Hyperlipidemia: Continue pravastatin.  Dysphagia --Pt was found to refuse PO intake and pocketing food.  SLP recommended dysphagia diet for now.  Lucila Maine believes pt will do better at home with eating and drinking. --Pt has been receiving intermittent IVF for hydration. PLAN: --Bicarb with sterile water@100  ml/hr for 10 hours  Hypernatremia --Na gradually trending up, to 147 today, likely due to poor PO intake and free water deficit. PLAN: --Bicarb with sterile water@100  ml/hr for 10 hours  # Pressure Injury 06/17/19 Sacrum Unstageable, POA    DVT prophylaxis: SCD/Compression stockings Code Status: Full code  Goals of care: Admitting physician discussed with patient's grandson Frank Delgado by phone, [phone].  Grandson at this time wants to continue with full scope of care including full CODE STATUS in the event of cardiac/respiratory arrest.  He wants his grandfather to return home to him by Wednesday.  I talked with grandson today on the phone as well.  Grandson confirmed he still wants to take pt home on Wed, and believes pt will eat and drink better at home.  Not interested in palliative care currently.  Continue Full Code.  Disposition Plan: Home tomorrow with HH and DME, per grandson   Subjective and Interval History:  ROS can't be obtained due to pt's dementia.  Pt only makes one repetitive sound.  Still not eating or drinking.  No fever, N/V/D, increased swelling.  Vitals stable.  Talked with grandson (caregiver) about pt's current condition and  goals of care.  Lucila Maine believes pt will do better at home with eating and drinking, and wants to take pt home tomorrow.  Not interested in palliative care at this  time.   Objective: Vitals:   06/19/19 1643 06/19/19 2329 06/20/19 0800 06/20/19 1234  BP: (!) 166/66 (!) 104/59 119/85 116/82  Pulse: 68   81  Resp: 17   18  Temp:  (!) 97.1 F (36.2 C)    TempSrc:  Axillary    SpO2: 100%   100%  Weight:      Height:        Intake/Output Summary (Last 24 hours) at 06/20/2019 1850 Last data filed at 06/20/2019 1222 Gross per 24 hour  Intake 351.69 ml  Output --  Net 351.69 ml   Filed Weights   06/17/19 2106  Weight: 81.6 kg    Examination:   Constitutional: NAD, not oriented, only making one repetitive sound, sleeping with his mouth wide open, with food residue inside, mittens on HEENT: conjunctivae and lids normal, EOMI CV: RRR no M,R,G. Distal pulses +2.  No cyanosis.   RESP: CTA B/L over anterior, normal respiratory effort, on RA GI: +BS, NTND Extremities: No effusions, edema in BLE SKIN: warm, dry and intact Neuro: II - XII grossly intact.     Data Reviewed: I have personally reviewed following labs and imaging studies  CBC: Recent Labs  Lab 06/17/19 1631 06/18/19 0513 06/19/19 0843 06/20/19 0308  WBC 14.2*  13.9* 10.3 10.5 8.9  NEUTROABS 11.1*  ORDER MODIFIED OR RESCHEDULED 9.6* 9.6* 8.0*  HGB 6.5*  6.4* 7.8* 9.7* 8.3*  HCT 23.6*  24.1* 26.6* 32.6* 28.9*  MCV 69.0*  68.3* 71.5* 69.4* 70.7*  PLT 520*  520* 359 520* 508*   Basic Metabolic Panel: Recent Labs  Lab 06/17/19 1631 06/18/19 0513 06/19/19 0843 06/20/19 0308  NA 143 143 146* 147*  K 4.3 4.1 4.5 4.1  CL 111 115* 118* 121*  CO2 19* 15* 15* 18*  GLUCOSE 195* 297* 274* 237*  BUN 36* 37* 43* 49*  CREATININE 1.38* 1.18 1.09 0.95  CALCIUM 8.6* 7.7* 8.6* 8.6*  MG  --  2.3 2.5* 2.6*  PHOS  --  3.5  --  2.5   GFR: Estimated Creatinine Clearance: 39.5 mL/min (by C-G formula based on SCr of 0.95 mg/dL). Liver Function Tests: Recent Labs  Lab 06/17/19 1631 06/18/19 0513 06/19/19 0843 06/20/19 0308  AST 34 21 17 16   ALT 25 22 21 20   ALKPHOS 114 98  105 95  BILITOT 0.9 0.7 1.1 0.7  PROT 7.4 6.8 7.4 6.4*  ALBUMIN 2.4* 2.1* 2.2* 2.1*   No results for input(s): LIPASE, AMYLASE in the last 168 hours. No results for input(s): AMMONIA in the last 168 hours. Coagulation Profile: No results for input(s): INR, PROTIME in the last 168 hours. Cardiac Enzymes: No results for input(s): CKTOTAL, CKMB, CKMBINDEX, TROPONINI in the last 168 hours. BNP (last 3 results) No results for input(s): PROBNP in the last 8760 hours. HbA1C: No results for input(s): HGBA1C in the last 72 hours. CBG: Recent Labs  Lab 06/19/19 1653 06/19/19 2103 06/20/19 0755 06/20/19 1205 06/20/19 1649  GLUCAP 207* 247* 234* 240* 183*   Lipid Profile: No results for input(s): CHOL, HDL, LDLCALC, TRIG, CHOLHDL, LDLDIRECT in the last 72 hours. Thyroid Function Tests: No results for input(s): TSH, T4TOTAL, FREET4, T3FREE, THYROIDAB in the last 72 hours. Anemia Panel: Recent Labs    06/18/19 0513  FERRITIN 23*  Sepsis Labs: Recent Labs  Lab 06/17/19 1631 06/17/19 2109 06/18/19 0119 06/19/19 1531 06/20/19 0308  PROCALCITON 0.74  --   --   --  0.55  LATICACIDVEN 2.8* 6.9* 2.5* 2.5*  --     Recent Results (from the past 240 hour(s))  Blood Culture (routine x 2)     Status: None (Preliminary result)   Collection Time: 06/17/19  4:31 PM   Specimen: BLOOD  Result Value Ref Range Status   Specimen Description BLOOD BLOOD RIGHT HAND  Final   Special Requests   Final    BOTTLES DRAWN AEROBIC AND ANAEROBIC Blood Culture adequate volume   Culture   Final    NO GROWTH 3 DAYS Performed at [facility], [address], [place], [place] [address]    Report Status PENDING  Incomplete  Blood Culture (routine x 2)     Status: None (Preliminary result)   Collection Time: 06/17/19  4:31 PM   Specimen: BLOOD  Result Value Ref Range Status   Specimen Description BLOOD BLOOD LEFT WRIST  Final   Special Requests   Final    BOTTLES DRAWN AEROBIC AND ANAEROBIC  Blood Culture adequate volume   Culture   Final    NO GROWTH 3 DAYS Performed at [facility], [address] [place], [place] [address]    Report Status PENDING  Incomplete  Urine culture     Status: None   Collection Time: 06/17/19  5:19 PM   Specimen: In/Out Cath Urine  Result Value Ref Range Status   Specimen Description   Final    IN/OUT CATH URINE Performed at [facility], [address] [place], [place] [address]    Special Requests   Final    NONE Performed at [facility], [address] [place], [place] [address]    Culture   Final    NO GROWTH Performed at [facility], [address], [place], [place] [address]    Report Status 06/19/2019 FINAL  Final  Respiratory Panel by RT PCR (Flu A&B, Covid) - Nasopharyngeal Swab     Status: Abnormal   Collection Time: 06/17/19  7:00 PM   Specimen: Nasopharyngeal Swab  Result Value Ref Range Status   SARS Coronavirus 2 by RT PCR POSITIVE (A) NEGATIVE Final    Comment: RESULT CALLED TO, READ BACK BY AND VERIFIED WITH: LEIGH FERGUSON ON 06/17/19 AT [address] [facility] (NOTE) SARS-CoV-2 target nucleic acids are DETECTED. SARS-CoV-2 RNA is generally detectable in upper respiratory specimens  during the acute phase of infection. Positive results are indicative of the presence of the identified virus, but do not rule out bacterial infection or co-infection with other pathogens not detected by the test. Clinical correlation with patient history and other diagnostic information is necessary to determine patient infection status. The expected result is Negative. Fact Sheet for Patients:  https://www.moore.com/ Fact Sheet for Healthcare Providers: https://www.young.biz/ This test is not yet approved or cleared by the [place] FDA and  has been authorized for detection and/or diagnosis of SARS-CoV-2 by FDA under an Emergency Use Authorization (EUA).  This EUA  will remain in effect (meaning this test can be used)  for the duration of  the COVID-19 declaration under Section 564(b)(1) of the Act, 21 U.S.C. section 360bbb-3(b)(1), unless the authorization is terminated or revoked sooner.    Influenza A by PCR NEGATIVE NEGATIVE Final   Influenza B by PCR NEGATIVE NEGATIVE Final    Comment: (NOTE) The  Xpert Xpress SARS-CoV-2/FLU/RSV assay is intended as an aid in  the diagnosis of influenza from Nasopharyngeal swab specimens and  should not be used as a sole basis for treatment. Nasal washings and  aspirates are unacceptable for Xpert Xpress SARS-CoV-2/FLU/RSV  testing. Fact Sheet for Patients: PinkCheek.be Fact Sheet for Healthcare Providers: GravelBags.it This test is not yet approved or cleared by the [place] FDA and  has been authorized for detection and/or diagnosis of SARS-CoV-2 by  FDA under an Emergency Use Authorization (EUA). This EUA will remain  in effect (meaning this test can be used) for the duration of the  Covid-19 declaration under Section 564(b)(1) of the Act, 21  U.S.C. section 360bbb-3(b)(1), unless the authorization is  terminated or revoked. Performed at [facility], [address] [place],  [address]       Radiology Studies: No results found.   Scheduled Meds: . vitamin C  500 mg Oral Daily  . collagenase   Topical Daily  . dexamethasone (DECADRON) injection  6 mg Intravenous Q24H  . feeding supplement (NEPRO CARB STEADY)  237 mL Oral TID BM  . influenza vaccine adjuvanted  0.5 mL Intramuscular Tomorrow-1000  . insulin aspart  0-15 Units Subcutaneous TID WC  . insulin glargine  8 Units Subcutaneous Daily  . Ipratropium-Albuterol  1 puff Inhalation Q6H  . mouth rinse  15 mL Mouth Rinse BID  . multivitamin with minerals  1 tablet Oral Daily  . pantoprazole  40 mg Intravenous Q12H  . pneumococcal 23 valent vaccine  0.5 mL  Intramuscular Tomorrow-1000  . pravastatin  40 mg Oral QHS  . sodium chloride flush  3 mL Intravenous Q12H  . zinc sulfate  220 mg Oral Daily   Continuous Infusions: . sodium chloride 100 mL/hr at 06/20/19 1222  . remdesivir 100 mg in NS 100 mL Stopped (06/20/19 0851)     LOS: 3 days     Enzo Bi, MD [facility] If 7PM-7AM, please contact night-coverage 06/20/2019, 6:50 PM

## 2019-06-20 NOTE — TOC Initial Note (Signed)
Transition of Care [facility]) - Initial/Assessment Note    Patient Details  Name: Frank Delgado MRN: [id number] Date of Birth: Apr 19, 1914  Transition of [facility] CM/SW Contact:    Allayne Butcher, RN Phone Number: 06/20/2019, 2:56 PM  Clinical Narrative:                 Patient admitted with COVID from home.  Patient's grandson [name] lives with patient and reports that he and his Mother [name] are the patient's primary caregivers.  [name] says that the patient has had a recent decline over the past 3 weeks, but before the decline the patient was eating, alert and verbal, and walking with a walker.   [name] would like for patient to return home at discharge he does not even want to consider SNF.  [name] would like to get a hospital bed and lift.  Volanda Napoleon would also like home health.  Home Health referral given to [place] with [facility] for RN, PT, OT, aide, SW, and speech.  [facility] has accepted the home health referral.  RNCM will cont to follow and assist with discharge planning.   Expected Discharge Plan: Home w Home Health Services Barriers to Discharge: Continued Medical Work up   Patient Goals and CMS Choice Patient states their goals for this hospitalization and ongoing recovery are:: Family wants to get patient back home CMS Medicare.gov Compare Post Acute Care list provided to:: Patient Represenative (must comment) Choice offered to / list presented to : NA(Grandson [name])  Expected Discharge Plan and Services Expected Discharge Plan: Home w Home Health Services   Discharge Planning Services: CM Consult Post Acute Care Choice: Home Health Living arrangements for the past 2 months: Single Family Home                                      Prior Living Arrangements/Services Living arrangements for the past 2 months: Single Family Home Lives with:: Relatives Patient language and need for interpreter reviewed:: Yes        Need for Family  Participation in Patient Care: Yes (Comment)(COVID +) Care giver support system in place?: Yes (comment)(grandson and oldest daughter) Current home services: DME(walker and wheelchair) Criminal Activity/Legal Involvement Pertinent to Current Situation/Hospitalization: No - Comment as needed  Activities of Daily Living Home Assistive Devices/Equipment: Environmental consultant (specify type) ADL Screening (condition at time of admission) Patient's cognitive ability adequate to safely complete daily activities?: No Is the patient deaf or have difficulty hearing?: Yes Does the patient have difficulty seeing, even when wearing glasses/contacts?: Yes Does the patient have difficulty concentrating, remembering, or making decisions?: Yes Patient able to express need for assistance with ADLs?: No Does the patient have difficulty dressing or bathing?: Yes Independently performs ADLs?: No Does the patient have difficulty walking or climbing stairs?: Yes Weakness of Legs: Both Weakness of Arms/Hands: Both  Permission Sought/Granted Permission sought to share information with : Case Manager, Family Supports Permission granted to share information with : Yes, Verbal Permission Granted     Permission granted to share info w AGENCY: Home Health agency  Permission granted to share info w Relationship: Volanda Napoleon     Emotional Assessment         Alcohol / Substance Use: Not Applicable Psych Involvement: No (comment)  Admission diagnosis:  Confusion [R41.0] SIRS (systemic inflammatory response syndrome) (HCC) [R65.10] AKI (acute kidney injury) (HCC) [N17.9] Acute hypoxemic respiratory failure  due to COVID-19 ([place]) [U07.1, J96.01] COVID-19 [U07.1] Patient Active Problem List   Diagnosis Date Noted  . Pressure injury of skin 06/18/2019  . Acute hypoxemic respiratory failure due to COVID-19 ([place]) 06/17/2019  . AKI (acute kidney injury) ([place]) 06/17/2019  . Iron deficiency anemia due to chronic blood loss  06/17/2019  . CHF (congestive heart failure) ([place])   . Diabetes mellitus without complication ([place])   . Hyperlipidemia associated with type 2 diabetes mellitus ([place])   . Back pain 01/31/2016  . Generalized weakness 01/31/2016  . Hypertension associated with diabetes ([place]) 01/31/2016  . Abnormal EKG 01/31/2016  . Anasarca 06/17/2015   PCP:  Cletis Athens, MD Pharmacy:   [facility] [address] [place] [address] [place] [address] Phone: [phone] Fax: [facility] [place], [place] [address] Phone: [phone] Fax: [phone]  [id number] [place], [place] - [place] AT [address] [place] [address] Phone: [phone] Fax: [phone]     Social Determinants of Health (SDOH) Interventions    Readmission Risk Interventions No flowsheet data found.

## 2019-06-20 NOTE — Progress Notes (Signed)
Spit out most of morning meds that were crushed in applesauce

## 2019-06-20 NOTE — Consult Note (Signed)
WOC Nurse Consult Note: Reason for Consult: Consult requested for buttocks. Pt is in isolation for Covid. Wound type: Pt had a previous deep tissue pressure injury across bilat buttocks and sacrum which has evolved into unstageable in the center of the wound.  It has loose peeling patchy areas of skin loss to other areas.  Affected area is approx 8X8cm, some odor, small amt tan drainage. Pressure Injury POA: Yes Dressing procedure/placement/frequency: Topical treatment orders provided for bedside nurses to perform as follows to provide enzymatic debridement of nonviable tissue: Apply Santyl to sacrum/buttocks wounds Q day, then cover with moist gauze and foam dressing.  (change foam dressing Q 3 days or PRN soiling.) Please re-consult if further assistance is needed.  Thank-you,  Cammie Mcgee MSN, RN, CWOCN, [place], CNS [phone]

## 2019-06-20 NOTE — Progress Notes (Signed)
Daily update given to pts grandson.

## 2019-06-20 NOTE — Progress Notes (Signed)
Inpatient Diabetes Program Recommendations  AACE/ADA: New Consensus Statement on Inpatient Glycemic Control (2015)  Target Ranges:  Prepandial:   less than 140 mg/dL      Peak postprandial:   less than 180 mg/dL (1-2 hours)      Critically ill patients:  140 - 180 mg/dL   Results for [name], [name] (MRN [id number]) as of 06/20/2019 14:54  Ref. Range 06/19/2019 08:09 06/19/2019 11:09 06/19/2019 15:40 06/19/2019 16:53 06/19/2019 21:03  Glucose-Capillary Latest Ref Range: 70 - 99 mg/dL 151 (H)  5 units NOVOLOG  251 (H)  5 units NOVOLOG  211 (H) 207 (H)  5 units NOVOLOG +  8 units LANTUS  247 (H)   Results for [name], [name] (MRN [id number]) as of 06/20/2019 14:54  Ref. Range 06/20/2019 07:55 06/20/2019 12:05  Glucose-Capillary Latest Ref Range: 70 - 99 mg/dL 062 (H)  5 units NOVOLOG +  8 units LANTUS  240 (H)  5 units NOVOLOG     Home DM Meds: Amaryl 2mg  Daily                             Tresiba 5 units QHS (for CBG >200)  Current Orders: Novolog Moderate Correction Scale/ SSI (0-15 units) TID AC       Lantus 8 units Daily    Getting Decadron 6 mg Daily.    MD- Please consider:  1. Increase Lantus to 12 units Daily  2. Increase frequency of Novolog SSI to Q4 hours (poor PO intake pr RN notes)     --Will follow patient during hospitalization--  RN, MSN, CDE Diabetes Coordinator Inpatient Glycemic Control Team Team Pager: [phone] (8a-5p)

## 2019-06-21 LAB — COMPREHENSIVE METABOLIC PANEL
ALT: 17 U/L (ref 0–44)
AST: 15 U/L (ref 15–41)
Albumin: 2.1 g/dL — ABNORMAL LOW (ref 3.5–5.0)
Alkaline Phosphatase: 81 U/L (ref 38–126)
Anion gap: 7 (ref 5–15)
BUN: 44 mg/dL — ABNORMAL HIGH (ref 8–23)
CO2: 21 mmol/L — ABNORMAL LOW (ref 22–32)
Calcium: 8.6 mg/dL — ABNORMAL LOW (ref 8.9–10.3)
Chloride: 122 mmol/L — ABNORMAL HIGH (ref 98–111)
Creatinine, Ser: 1.06 mg/dL (ref 0.61–1.24)
GFR calc Af Amer: >60 mL/min (ref >60)
GFR calc non Af Amer: 56 mL/min — ABNORMAL LOW (ref >60)
Glucose, Bld: 229 mg/dL — ABNORMAL HIGH (ref 70–99)
Potassium: 4.1 mmol/L (ref 3.5–5.1)
Sodium: 150 mmol/L — ABNORMAL HIGH (ref 135–145)
Total Bilirubin: 0.9 mg/dL (ref 0.3–1.2)
Total Protein: 6.4 g/dL — ABNORMAL LOW (ref 6.5–8.1)

## 2019-06-21 LAB — CBC WITH DIFFERENTIAL/PLATELET
Abs Immature Granulocytes: 0.06 10*3/uL (ref 0.00–0.07)
Basophils Absolute: 0 10*3/uL (ref 0.0–0.1)
Basophils Relative: 0 %
Eosinophils Absolute: 0 10*3/uL (ref 0.0–0.5)
Eosinophils Relative: 0 %
HCT: 27.8 % — ABNORMAL LOW (ref 39.0–52.0)
Hemoglobin: 8.3 g/dL — ABNORMAL LOW (ref 13.0–17.0)
Immature Granulocytes: 1 %
Lymphocytes Relative: 7 %
Lymphs Abs: 0.6 10*3/uL — ABNORMAL LOW (ref 0.7–4.0)
MCH: 20.6 pg — ABNORMAL LOW (ref 26.0–34.0)
MCHC: 29.9 g/dL — ABNORMAL LOW (ref 30.0–36.0)
MCV: 69 fL — ABNORMAL LOW (ref 80.0–100.0)
Monocytes Absolute: 0.6 10*3/uL (ref 0.1–1.0)
Monocytes Relative: 6 %
Neutro Abs: 7.5 10*3/uL (ref 1.7–7.7)
Neutrophils Relative %: 86 %
Platelets: 493 10*3/uL — ABNORMAL HIGH (ref 150–400)
RBC: 4.03 MIL/uL — ABNORMAL LOW (ref 4.22–5.81)
RDW: 21.2 % — ABNORMAL HIGH (ref 11.5–15.5)
Smear Review: NORMAL
WBC: 8.7 10*3/uL (ref 4.0–10.5)
nRBC: 0.6 % — ABNORMAL HIGH (ref 0.0–0.2)

## 2019-06-21 LAB — GLUCOSE, CAPILLARY
Glucose-Capillary: 224 mg/dL — ABNORMAL HIGH (ref 70–99)
Glucose-Capillary: 141 mg/dL — ABNORMAL HIGH (ref 70–99)
Glucose-Capillary: 121 mg/dL — ABNORMAL HIGH (ref 70–99)

## 2019-06-21 LAB — MAGNESIUM: Magnesium: 2.6 mg/dL — ABNORMAL HIGH (ref 1.7–2.4)

## 2019-06-21 MED ORDER — STERILE WATER FOR INJECTION IV SOLN
INTRAVENOUS | Status: DC
  Filled 2019-06-21 (×3): qty 850

## 2019-06-21 MED ORDER — STERILE WATER FOR INJECTION IV SOLN: INTRAVENOUS | Status: DC

## 2019-06-21 MED ORDER — PANTOPRAZOLE SODIUM 40 MG PO TBEC: 40.0000 mg | DELAYED_RELEASE_TABLET | Freq: Two times a day (BID) | ORAL | 0 refills | Status: AC

## 2019-06-21 MED ORDER — ZINC SULFATE 220 (50 ZN) MG PO CAPS: 220.0000 mg | ORAL_CAPSULE | Freq: Every day | ORAL | 0 refills | Status: AC

## 2019-06-21 MED ORDER — ASCORBIC ACID 500 MG PO TABS: 500.0000 mg | ORAL_TABLET | Freq: Every day | ORAL | 0 refills | Status: AC

## 2019-06-21 MED ORDER — SODIUM CHLORIDE 0.45 % IV SOLN: INTRAVENOUS | Status: DC

## 2019-06-21 NOTE — Progress Notes (Signed)
Family given daily update 

## 2019-06-21 NOTE — Progress Notes (Signed)
Scheduled EMS transport for 6pm

## 2019-06-21 NOTE — Progress Notes (Signed)
Attempted to feed pt several times, pt spitting out or holding anything in his mouth

## 2019-06-21 NOTE — Progress Notes (Signed)
Pt being discharged home with home health via EMS, grandson aware of discharge and waiting for pts arrival, EMS transport notified to arrive at 7pm to pick up pt, discharge instructions with pt belongings, pt with no complaints, resting comfortably, bath and mouth care performed

## 2019-06-21 NOTE — Discharge Summary (Signed)
Physician Discharge Summary  Frank Delgado OMV:[id number] DOB: September 10, 1913 DOA: 06/17/2019  PCP: Corky Downs, MD  Admit date: 06/17/2019 Discharge date: 06/21/2019  Admitted From: Home Disposition: Home with home health  Recommendations for Outpatient Follow-up:  1. Follow up with PCP in 1-2 weeks 2.   Home Health: Yes Equipment/Devices: Hospital bed, [facility] lift  Discharge Condition: Stable CODE STATUS: Full Diet recommendation: Dysphagia   Brief/Interim Summary: Frank Delgado a 86 y.o.Caucasian malewith medical history significant for type 2 diabetes, hypertension, hyperlipidemia, and iron deficiency anemia who presents to the ED for evaluation of fevers, dyspnea, and altered mental status.  Completed IV remdesivir in house. Lowest documented oxygen saturation was 91% on 06/17/2019 Received steroids in house however given stability on room air would not continue on discharge Mental status remains somewhat poor though the patient does respond by name Has not been eating well Repeated discussions with the patient's family members and primary caregivers They wish to being the patient back home where he may be able to eat in more comfortable surroundings This gentleman is clearly in exceptional shape considering his very advanced age He has been well cared for by his child and grandchild My partners have had discussions with him regarding the patient's CODE STATUS At this time they wish to keep the patient full code It is my clinical opinion that a gentleman at this age should not be a full code and that the trauma of the resuscitation should he decompensate rapidly would not result in improvement in either quality or length of life. Per my partners conversations the family is not interested in palliative care at this time. It was the recommendation of the multidisciplinary team and the hospitalist service that the patient and family consider skilled nursing facility placement given the  patient's advanced age and multiple medical comorbidities.  This was offered to the family and they are not interested in it.  Home health skilled nursing, PT, OT, SLP have been ordered.  On 06/21/2019 the patient is medically stable for discharge.  Home health is being arranged.  Hospital bed and lift have been delivered to the house.  The patient will be discharged home.   Discharge Diagnoses:  Principal Problem:   Acute hypoxemic respiratory failure due to COVID-19 [facility]) Active Problems:   AKI (acute kidney injury) (HCC)   Iron deficiency anemia due to chronic blood loss   CHF (congestive heart failure) (HCC)   Diabetes mellitus without complication (HCC)   Hyperlipidemia associated with type 2 diabetes mellitus (HCC)   Pressure injury of skin  Acute hypoxic respiratory failure due to COVID-19 viralpneumonia: SARS-CoV-2 PCR positive 06/17/2019.  He was requiring 4 L supplemental O2 via [place] on presentation.  Chest x-ray shows bilateral patchy airspace opacities. --Already on RA by 06/19/19. PLAN: Completed IV remdesivir in house No clear benefit or indication for post discharge steroid therapy.  Patient has been on room air for the last 48+ hours Prescription written for vitamin supplementation post discharge Medically stable for discharge home at this time   Anemia likely due to chronic blood loss and iron deficiency: Hemoglobin 6.4 on arrival.  Black stool which was guaiac positive per EDP. -s/p 1 unit PRBC with appropriate rise in Hgb -Protonix 40 mg IV twice daily in house - Transition to Protonix 40 mg p.o. twice daily on discharge -We will prescribe 6-week course -Hold home Nexium while on Protonix -Can resume Nexium when Protonix treatment course ends -Can continue home aspirin on discharge  Acute kidney injury,  resolved Likely prerenal from dehydration in setting of COVID-19 infection and anemia.  S/p IV fluid hydration.  Home Lasix on hold.  Can resume on  discharge Patient refusing food in hospital Inadequate p.o. fluid intake Per patient's grandson patient will be more comfortable eating and drinking at home  Lactic acidosis, improved Significantly volume depleted. S/p IV fluid hydration and transfusion of 1 unit PRBC as above.  Elevated troponin: Mildly elevated, likely demand ischemia from above the above.  Acute metabolic encephalopathy Likely underlying dementia --Reportedly had decline for several weeks prior to presentation.   -Mental status mildly improved during admission  Type 2 diabetes Hyperglycemia 2/2 steroids Holding home Amaryl.  Continue sensitive SSI while in hospital.  Hyperlipidemia: Continue pravastatin.  Dysphagia --Pt was found to refuse PO intake and pocketing food.  SLP recommended dysphagia diet for now.  Yolanda Bonine believes pt will do better at home with eating and drinking.   Hypernatremia --Na gradually trending up, to 147 today, likely due to poor PO intake and free water deficit. Encourage p.o. fluid intake on discharge  # Pressure Injury 06/17/19 Sacrum Unstageable, POA  Discharge Instructions  Discharge Instructions    Diet - low sodium heart healthy   Complete by: As directed    Increase activity slowly   Complete by: As directed    MyChart COVID-19 home monitoring program   Complete by: Jun 21, 2019    Is the patient willing to use the [place] for home monitoring?: No     Allergies as of 06/21/2019   No Known Allergies     Medication List    TAKE these medications   ascorbic acid 500 MG tablet Commonly known as: VITAMIN C Take 1 tablet (500 mg total) by mouth daily. Start taking on: June 22, 2019   aspirin 81 MG EC tablet Take 1 tablet (81 mg total) by mouth daily.   Ferrex 150 Forte 150-1-25 MG-MG-MCG Caps Generic drug: Polysacchar Iron-FA-B12 Take 1 capsule by mouth daily.   furosemide 20 MG tablet Commonly known as: LASIX Take 40 mg by mouth  daily.   glimepiride 2 MG tablet Commonly known as: AMARYL Take 2 mg by mouth daily with breakfast.   omeprazole 20 MG capsule Commonly known as: PRILOSEC Take 20 mg by mouth daily.   potassium chloride 10 MEQ tablet Commonly known as: KLOR-CON Take 10 mEq by mouth daily.   pravastatin 40 MG tablet Commonly known as: PRAVACHOL Take 40 mg by mouth at bedtime.   Tyler Aas FlexTouch 100 UNIT/ML Sopn FlexTouch Pen Generic drug: insulin degludec Inject 5 Units into the skin at bedtime as needed (for sugar >200).   zinc sulfate 220 (50 Zn) MG capsule Take 1 capsule (220 mg total) by mouth daily. Start taking on: June 22, 2019            Durable Medical Equipment  (From admission, onward)         Start     Ordered   06/21/19 6720  For home use only DME Hospital bed  Once    Question Answer Comment  Length of Need Lifetime   Patient has (list medical condition): COVID 19, CHF, risk of aspiration   The above medical condition requires: Patient requires the ability to reposition frequently   Head must be elevated greater than: 30 degrees   Bed type Semi-electric   Hoyer Lift Yes   Support Surface: Gel Overlay      06/21/19 9470  No Known Allergies  Consultations:  None   Procedures/Studies: DG Chest Port 1 View  Result Date: 06/17/2019 CLINICAL DATA:  Fever, altered mental status EXAM: PORTABLE CHEST 1 VIEW COMPARISON:  04/13/2016 FINDINGS: Patchy bilateral airspace opacities are noted, left greater than right. Heart is borderline in size. No effusions or pneumothorax. No acute bony abnormality. IMPRESSION: Patchy bilateral airspace disease, left greater than right concerning for pneumonia. Electronically Signed   By: Charlett Nose M.D.   On: 06/17/2019 17:09    (Echo, Carotid, EGD, Colonoscopy, ERCP)    Subjective:   Discharge Exam: Vitals:   06/21/19 0836 06/21/19 1548  BP: (!) 149/69   Pulse: 87   Resp: 18 16  Temp: (!) 97.4 F (36.3 C)  (!) 97.4 F (36.3 C)  SpO2: 100%    Vitals:   06/20/19 2306 06/21/19 0725 06/21/19 0836 06/21/19 1548  BP: 124/73  (!) 149/69   Pulse:   87   Resp: 17  18 16   Temp: 98.7 F (37.1 C)  (!) 97.4 F (36.3 C) (!) 97.4 F (36.3 C)  TempSrc: Axillary  Axillary Oral  SpO2:  100% 100%   Weight:      Height:        Constitutional: NAD, not oriented, incomprehensible speech but does follow basic commands HEENT: conjunctivae and lids normal, EOMI CV: RRR no M,R,G. Distal pulses +2.  No cyanosis.   RESP: CTA B/L over anterior, normal respiratory effort, on RA GI: +BS, NTND Extremities: No effusions, edema in BLE SKIN: warm, dry and intact Neuro: II - XII grossly intact.      The results of significant diagnostics from this hospitalization (including imaging, microbiology, ancillary and laboratory) are listed below for reference.     Microbiology: Recent Results (from the past 240 hour(s))  Blood Culture (routine x 2)     Status: None (Preliminary result)   Collection Time: 06/17/19  4:31 PM   Specimen: BLOOD  Result Value Ref Range Status   Specimen Description BLOOD BLOOD RIGHT HAND  Final   Special Requests   Final    BOTTLES DRAWN AEROBIC AND ANAEROBIC Blood Culture adequate volume   Culture   Final    NO GROWTH 4 DAYS Performed at [facility], [address], [place], [place]    Report Status PENDING  Incomplete  Blood Culture (routine x 2)     Status: None (Preliminary result)   Collection Time: 06/17/19  4:31 PM   Specimen: BLOOD  Result Value Ref Range Status   Specimen Description BLOOD BLOOD LEFT WRIST  Final   Special Requests   Final    BOTTLES DRAWN AEROBIC AND ANAEROBIC Blood Culture adequate volume   Culture   Final    NO GROWTH 4 DAYS Performed at [facility], [address], [place], [place]    Report Status PENDING  Incomplete  Urine culture     Status: None   Collection Time: 06/17/19  5:19 PM   Specimen:  In/Out Cath Urine  Result Value Ref Range Status   Specimen Description   Final    IN/OUT CATH URINE Performed at [facility], [address] [place], [place]    Special Requests   Final    NONE Performed at [facility], [address], [place], [place]    Culture   Final    NO GROWTH Performed at [facility], [address] [place], [place]  Report Status 06/19/2019 FINAL  Final  Respiratory Panel by RT PCR (Flu A&B, Covid) - Nasopharyngeal Swab     Status: Abnormal   Collection Time: 06/17/19  7:00 PM   Specimen: Nasopharyngeal Swab  Result Value Ref Range Status   SARS Coronavirus 2 by RT PCR POSITIVE (A) NEGATIVE Final    Comment: RESULT CALLED TO, READ BACK BY AND VERIFIED WITH: LEIGH FERGUSON ON 06/17/19 AT [facility] (NOTE) SARS-CoV-2 target nucleic acids are DETECTED. SARS-CoV-2 RNA is generally detectable in upper respiratory specimens  during the acute phase of infection. Positive results are indicative of the presence of the identified virus, but do not rule out bacterial infection or co-infection with other pathogens not detected by the test. Clinical correlation with patient history and other diagnostic information is necessary to determine patient infection status. The expected result is Negative. Fact Sheet for Patients:  https://www.moore.com/https://www.fda.gov/media/142436/download Fact Sheet for Healthcare Providers: https://www.young.biz/https://www.fda.gov/media/142435/download This test is not yet approved or cleared by the [place] FDA and  has been authorized for detection and/or diagnosis of SARS-CoV-2 by FDA under an Emergency Use Authorization (EUA).  This EUA will remain in effect (meaning this test can be used)  for the duration of  the COVID-19 declaration under Section 564(b)(1) of the Act, 21 U.S.C. section 360bbb-3(b)(1), unless the authorization is terminated or revoked sooner.    Influenza A by PCR NEGATIVE NEGATIVE  Final   Influenza B by PCR NEGATIVE NEGATIVE Final    Comment: (NOTE) The Xpert Xpress SARS-CoV-2/FLU/RSV assay is intended as an aid in  the diagnosis of influenza from Nasopharyngeal swab specimens and  should not be used as a sole basis for treatment. Nasal washings and  aspirates are unacceptable for Xpert Xpress SARS-CoV-2/FLU/RSV  testing. Fact Sheet for Patients: https://www.moore.com/https://www.fda.gov/media/142436/download Fact Sheet for Healthcare Providers: https://www.young.biz/https://www.fda.gov/media/142435/download This test is not yet approved or cleared by the [place] FDA and  has been authorized for detection and/or diagnosis of SARS-CoV-2 by  FDA under an Emergency Use Authorization (EUA). This EUA will remain  in effect (meaning this test can be used) for the duration of the  Covid-19 declaration under Section 564(b)(1) of the Act, 21  U.S.C. section 360bbb-3(b)(1), unless the authorization is  terminated or revoked. Performed at [facility], [address] [place], [place] [address]      Labs: BNP (last 3 results) Recent Labs    06/17/19 1631  BNP 127.0*   Basic Metabolic Panel: Recent Labs  Lab 06/17/19 1631 06/18/19 0513 06/19/19 0843 06/20/19 0308 06/21/19 0742  NA 143 143 146* 147* 150*  K 4.3 4.1 4.5 4.1 4.1  CL 111 115* 118* 121* 122*  CO2 19* 15* 15* 18* 21*  GLUCOSE 195* 297* 274* 237* 229*  BUN 36* 37* 43* 49* 44*  CREATININE 1.38* 1.18 1.09 0.95 1.06  CALCIUM 8.6* 7.7* 8.6* 8.6* 8.6*  MG  --  2.3 2.5* 2.6* 2.6*  PHOS  --  3.5  --  2.5  --    Liver Function Tests: Recent Labs  Lab 06/17/19 1631 06/18/19 0513 06/19/19 0843 06/20/19 0308 06/21/19 0742  AST 34 21 17 16 15   ALT 25 22 21 20 17   ALKPHOS 114 98 105 95 81  BILITOT 0.9 0.7 1.1 0.7 0.9  PROT 7.4 6.8 7.4 6.4* 6.4*  ALBUMIN 2.4* 2.1* 2.2* 2.1* 2.1*   No results for input(s): LIPASE, AMYLASE in the last 168 hours. No results for input(s): AMMONIA in the last 168 hours. CBC: Recent  Labs   Lab 06/17/19 1631 06/18/19 0513 06/19/19 0843 06/20/19 0308 06/21/19 0742  WBC 14.2*  13.9* 10.3 10.5 8.9 8.7  NEUTROABS 11.1*  ORDER MODIFIED OR RESCHEDULED 9.6* 9.6* 8.0* 7.5  HGB 6.5*  6.4* 7.8* 9.7* 8.3* 8.3*  HCT 23.6*  24.1* 26.6* 32.6* 28.9* 27.8*  MCV 69.0*  68.3* 71.5* 69.4* 70.7* 69.0*  PLT 520*  520* 359 520* 508* 493*   Cardiac Enzymes: No results for input(s): CKTOTAL, CKMB, CKMBINDEX, TROPONINI in the last 168 hours. BNP: Invalid input(s): POCBNP CBG: Recent Labs  Lab 06/20/19 1649 06/20/19 2113 06/21/19 0831 06/21/19 1136 06/21/19 1627  GLUCAP 183* 185* 224* 141* 121*   D-Dimer No results for input(s): DDIMER in the last 72 hours. Hgb A1c No results for input(s): HGBA1C in the last 72 hours. Lipid Profile No results for input(s): CHOL, HDL, LDLCALC, TRIG, CHOLHDL, LDLDIRECT in the last 72 hours. Thyroid function studies No results for input(s): TSH, T4TOTAL, T3FREE, THYROIDAB in the last 72 hours.  Invalid input(s): FREET3 Anemia work up No results for input(s): VITAMINB12, FOLATE, FERRITIN, TIBC, IRON, RETICCTPCT in the last 72 hours. Urinalysis    Component Value Date/Time   COLORURINE YELLOW (A) 06/17/2019 1719   APPEARANCEUR CLEAR (A) 06/17/2019 1719   APPEARANCEUR Turbid 03/28/2012 1359   LABSPEC 1.016 06/17/2019 1719   LABSPEC 1.009 03/28/2012 1359   PHURINE 5.0 06/17/2019 1719   GLUCOSEU NEGATIVE 06/17/2019 1719   GLUCOSEU 50 mg/dL 16/10/960410/21/2013 54091359   HGBUR NEGATIVE 06/17/2019 1719   BILIRUBINUR NEGATIVE 06/17/2019 1719   BILIRUBINUR Negative 03/28/2012 1359   KETONESUR NEGATIVE 06/17/2019 1719   PROTEINUR NEGATIVE 06/17/2019 1719   NITRITE NEGATIVE 06/17/2019 1719   LEUKOCYTESUR NEGATIVE 06/17/2019 1719   LEUKOCYTESUR 3+ 03/28/2012 1359   Sepsis Labs Invalid input(s): PROCALCITONIN,  WBC,  LACTICIDVEN Microbiology Recent Results (from the past 240 hour(s))  Blood Culture (routine x 2)     Status: None (Preliminary result)    Collection Time: 06/17/19  4:31 PM   Specimen: BLOOD  Result Value Ref Range Status   Specimen Description BLOOD BLOOD RIGHT HAND  Final   Special Requests   Final    BOTTLES DRAWN AEROBIC AND ANAEROBIC Blood Culture adequate volume   Culture   Final    NO GROWTH 4 DAYS Performed at [facility], [address] [place], [place] [address]    Report Status PENDING  Incomplete  Blood Culture (routine x 2)     Status: None (Preliminary result)   Collection Time: 06/17/19  4:31 PM   Specimen: BLOOD  Result Value Ref Range Status   Specimen Description BLOOD BLOOD LEFT WRIST  Final   Special Requests   Final    BOTTLES DRAWN AEROBIC AND ANAEROBIC Blood Culture adequate volume   Culture   Final    NO GROWTH 4 DAYS Performed at [facility], [address] [place], [place] [address]    Report Status PENDING  Incomplete  Urine culture     Status: None   Collection Time: 06/17/19  5:19 PM   Specimen: In/Out Cath Urine  Result Value Ref Range Status   Specimen Description   Final    IN/OUT CATH URINE Performed at [facility], [address] [place], [place] [address]    Special Requests   Final    NONE Performed at [facility], [address] [place], [place] [address]    Culture   Final    NO GROWTH Performed at  [facility], [address], [place], [place] [address]    Report Status 06/19/2019 FINAL  Final  Respiratory Panel by RT PCR (Flu A&B, Covid) - Nasopharyngeal Swab     Status: Abnormal   Collection Time: 06/17/19  7:00 PM   Specimen: Nasopharyngeal Swab  Result Value Ref Range Status   SARS Coronavirus 2 by RT PCR POSITIVE (A) NEGATIVE Final    Comment: RESULT CALLED TO, READ BACK BY AND VERIFIED WITH: LEIGH FERGUSON ON 06/17/19 AT 2101 [facility] (NOTE) SARS-CoV-2 target nucleic acids are DETECTED. SARS-CoV-2 RNA is generally detectable in upper respiratory specimens  during the acute phase of infection. Positive results  are indicative of the presence of the identified virus, but do not rule out bacterial infection or co-infection with other pathogens not detected by the test. Clinical correlation with patient history and other diagnostic information is necessary to determine patient infection status. The expected result is Negative. Fact Sheet for Patients:  https://www.moore.com/ Fact Sheet for Healthcare Providers: https://www.young.biz/ This test is not yet approved or cleared by the [place] FDA and  has been authorized for detection and/or diagnosis of SARS-CoV-2 by FDA under an Emergency Use Authorization (EUA).  This EUA will remain in effect (meaning this test can be used)  for the duration of  the COVID-19 declaration under Section 564(b)(1) of the Act, 21 U.S.C. section 360bbb-3(b)(1), unless the authorization is terminated or revoked sooner.    Influenza A by PCR NEGATIVE NEGATIVE Final   Influenza B by PCR NEGATIVE NEGATIVE Final    Comment: (NOTE) The Xpert Xpress SARS-CoV-2/FLU/RSV assay is intended as an aid in  the diagnosis of influenza from Nasopharyngeal swab specimens and  should not be used as a sole basis for treatment. Nasal washings and  aspirates are unacceptable for Xpert Xpress SARS-CoV-2/FLU/RSV  testing. Fact Sheet for Patients: https://www.moore.com/ Fact Sheet for Healthcare Providers: https://www.young.biz/ This test is not yet approved or cleared by the [place] FDA and  has been authorized for detection and/or diagnosis of SARS-CoV-2 by  FDA under an Emergency Use Authorization (EUA). This EUA will remain  in effect (meaning this test can be used) for the duration of the  Covid-19 declaration under Section 564(b)(1) of the Act, 21  U.S.C. section 360bbb-3(b)(1), unless the authorization is  terminated or revoked. Performed at [facility], [address]  [place], [place] [address]      Time coordinating discharge: Over 30 minutes  SIGNED:   Tresa Moore, MD  [facility] 06/21/2019, 5:34 PM Pager   If 7PM-7AM, please contact night-coverage [email or web address] Password [email or web address]

## 2019-06-21 NOTE — Progress Notes (Signed)
Patient discharged via EMS, IV removed, Grandson notified that patient on the way home.

## 2019-06-21 NOTE — TOC Transition Note (Signed)
Transition of Care [facility]) - CM/SW Discharge Note   Patient Details  Name: Frank Delgado MRN: [id number] Date of Birth: [birth year]  [facility] of [facility] CM/SW Contact:  Allayne Butcher, RN Phone Number: 06/21/2019, 2:34 PM   Clinical Narrative:    Patient will discharge home this afternoon, hospital bed will be delivered tomorrow.  Patient's grandson did not realize that the patient was ready for discharge today and he needed to prepare the home.  Grandson states he will be ready for the patient at 6pm.  Bedside RN will schedule EMS transport.     Final next level of care: Home w Home Health Services Barriers to Discharge: Barriers Resolved   Patient Goals and CMS Choice Patient states their goals for this hospitalization and ongoing recovery are:: Family wants to get patient back home CMS Medicare.gov Compare Post Acute Care list provided to:: Patient Represenative (must comment) Choice offered to / list presented to : NA(Grandson [name])  Discharge Placement                       Discharge Plan and Services   Discharge Planning Services: CM Consult Post Acute Care Choice: Home Health          DME Arranged: Hospital bed, Other see comment(hoyer lift) DME Agency: [employer] Date DME Agency Contacted: 06/21/19 Time DME Agency Contacted: 1020 Representative spoke with at DME Agency: Mitchell Heir HH Arranged: RN, PT, OT, Nurse's Aide, Speech Therapy, Social Work [facility] Agency: [facility] Date HH Agency Contacted: 06/21/19 Time HH Agency Contacted: 1020 Representative spoke with at [facility] Agency: Christy Gentles  Social Determinants of Health (SDOH) Interventions     Readmission Risk Interventions No flowsheet data found.

## 2019-06-21 NOTE — Progress Notes (Signed)
Patient has hypoxemic respiratory failure due to COVID 19 which requires patients head to be positioned in ways  not feasible with a normal bed. Head must be elevated at least 30 degrees or patient suffers shortness of breath and decrease of oxygen saturation. COVID 19 infection requires frequent and immediate changes in body position which cannot be achieved with a normal bed.

## 2019-06-21 NOTE — TOC Transition Note (Signed)
Transition of Care [facility]) - CM/SW Discharge Note   Patient Details  Name: Frank Delgado MRN: [id number] Date of Birth: [birth year]  Transition of Care [facility]) CM/SW Contact:  Allayne Butcher, RN Phone Number: 06/21/2019, 10:21 AM   Clinical Narrative:    Patient medically cleared for discharge home today.  Patient will return home with his Volanda Napoleon and home health services.  [facility] will provide home health RN, PT, OT, speech, aide and SW.  Christy Gentles with [facility] is aware of patient discharge today.   Hospital bed and hoyer lift have been ordered and will be delivered today to the patient's home.  Patient will need EMS transport home.     Final next level of care: Home w Home Health Services Barriers to Discharge: Barriers Resolved   Patient Goals and CMS Choice Patient states their goals for this hospitalization and ongoing recovery are:: Family wants to get patient back home CMS Medicare.gov Compare Post Acute Care list provided to:: Patient Represenative (must comment) Choice offered to / list presented to : NA(Grandson [name])  Discharge Placement                       Discharge Plan and Services   Discharge Planning Services: CM Consult Post Acute Care Choice: Home Health          DME Arranged: Hospital bed, Other see comment(hoyer lift) DME Agency: [employer] Date DME Agency Contacted: 06/21/19 Time DME Agency Contacted: 1020 Representative spoke with at DME Agency: Mitchell Heir HH Arranged: RN, PT, OT, Nurse's Aide, Speech Therapy, Social Work [facility] Agency: [facility] Date HH Agency Contacted: 06/21/19 Time HH Agency Contacted: 1020 Representative spoke with at [facility] Agency: Christy Gentles  Social Determinants of Health (SDOH) Interventions     Readmission Risk Interventions No flowsheet data found.

## 2019-06-22 ENCOUNTER — Other Ambulatory Visit: Payer: Self-pay

## 2019-06-22 LAB — CULTURE, BLOOD (ROUTINE X 2)
Culture: NO GROWTH
Culture: NO GROWTH
Special Requests: ADEQUATE
Special Requests: ADEQUATE

## 2019-06-22 NOTE — Patient Outreach (Signed)
[facility]) Care Management  06/22/2019  Jamill Wetmore [birth year] [id number]     Transition of Care Referral  Referral Date: 06/22/2019  Referral Source: [facility] Discharge Report Date of Admission:  Diagnosis: "acute resp failure secondary to COVID-19" Date of Discharge: 06/21/2019 Facility: [facility] Insurance: [employer] Medicare *PCP Office Does [employer]   Outreach attempt # 1 to patient. Spoke with a male who identified herself as patient's sister. She voices that patient is doing fairly well since coming home yesterday evening. She requested that RN CM contact patient's other sister-Margagret at [phone]. RN CM spoke with sister. She voices that patient has very strong family support system and several members helping care for him. SHe voices that she does not recall EMS who transported patient home going over his meds. RN CM discussed d/c paperwork with caregiver and advised that per inpatient note from RN paperwork had been sent home along with his belongings. Advised her to check his belongings bag. She stated she would do so but was not at home at present. She requested that RN CM go over meds that patient should be taking. RN CM reviewed d/c med list including new meds and med to stop taking. Sister voiced understanding. They are aware to expect call from [facility] agency soon to arrange home visit. They have not had a chance to make PCP appt but aware and will do. She denies any issues with transportation. She was appreciative of call but declined need for follow up. She was provided with RN CM contact info to call if needs/concerns arise.     Plan: RN CM will close case at this time.    Antionette Fairy, RN,BSN,CCM [facility] Care Management Telephonic Care Management Coordinator Direct Phone: [phone] Toll Free: [phone] Fax: [phone]

## 2019-07-10 DEATH — deceased

## 2020-10-26 IMAGING — DX DG CHEST 1V PORT
1 series · 1 of 1 positions shown · non-contrast
Comparison: 04/13/2016

CLINICAL DATA: Fever, altered mental status

EXAM:
PORTABLE CHEST 1 VIEW

[chest ap]
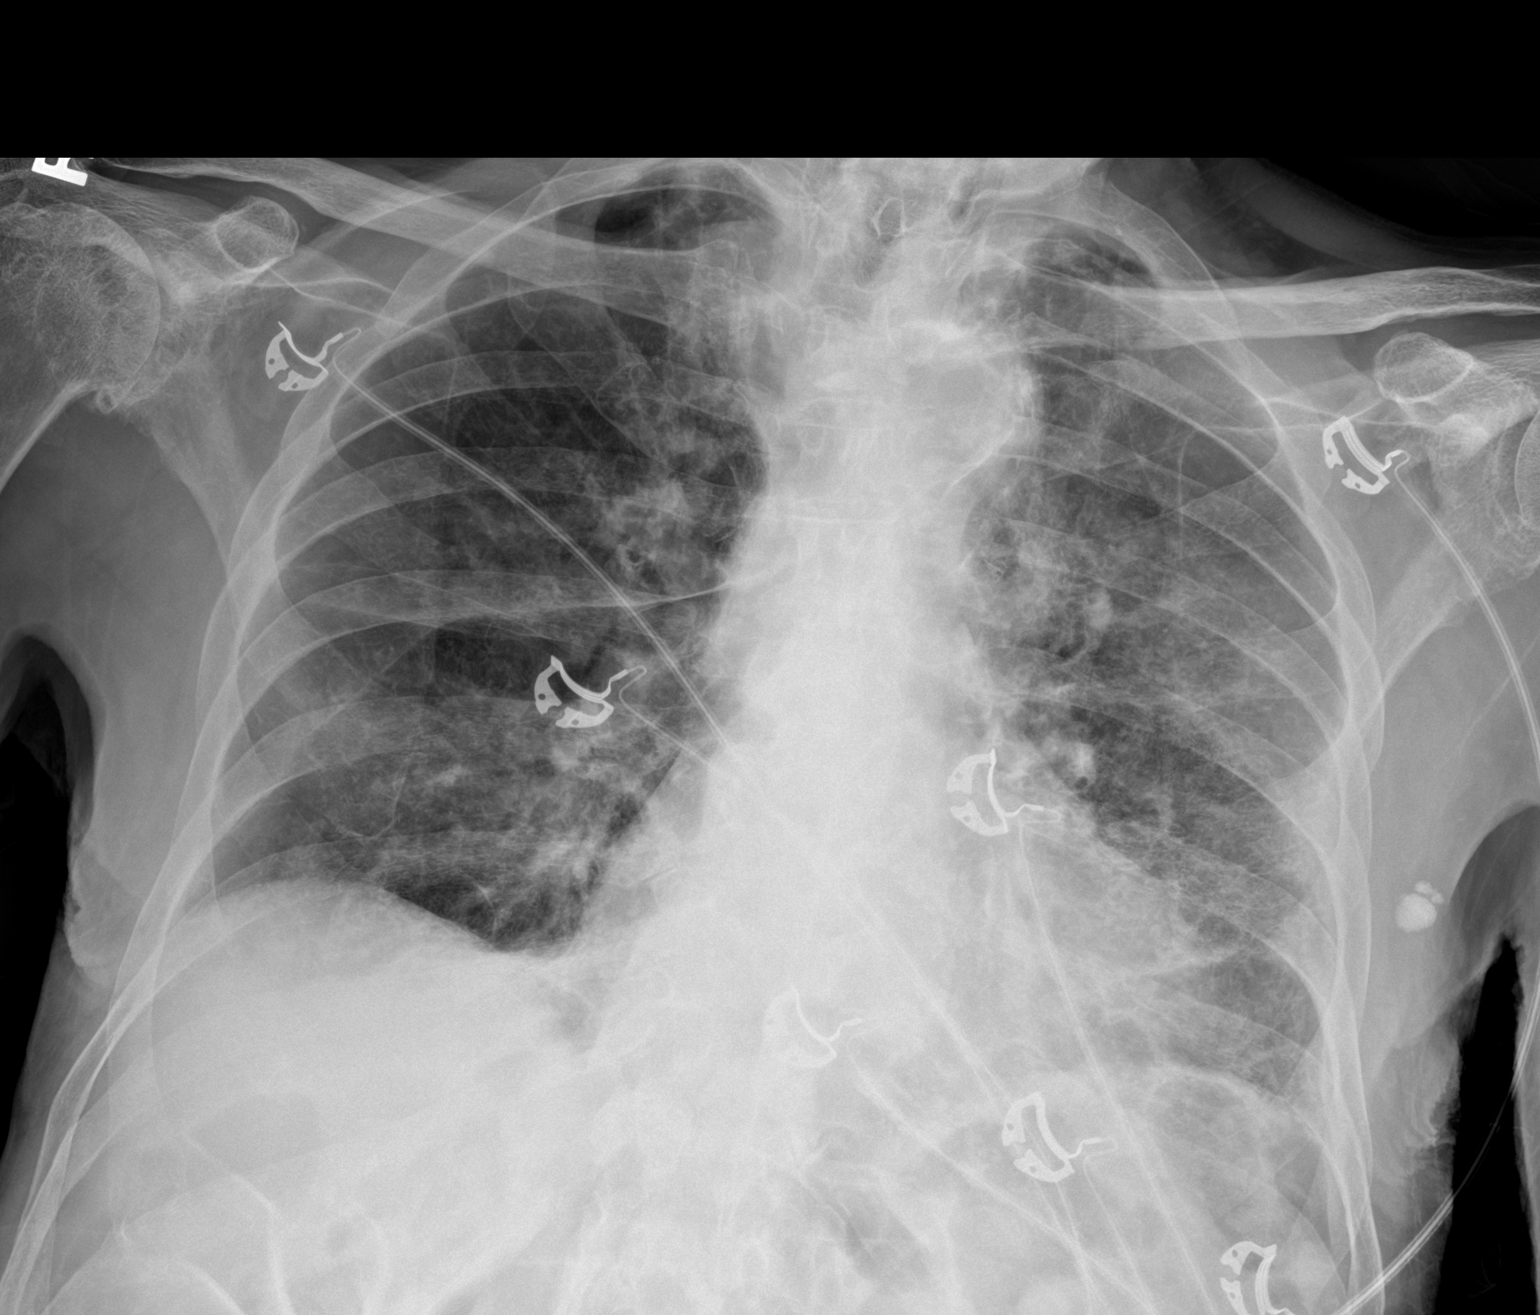

[1 of 1 positions shown; findings below may reference images not displayed]

FINDINGS: Patchy bilateral airspace opacities are noted, left greater than
right. Heart is borderline in size. No effusions or pneumothorax. No
acute bony abnormality.
IMPRESSION: Patchy bilateral airspace disease, left greater than right
concerning for pneumonia.
